# Patient Record
Sex: Male | Born: 1970 | Race: White | Hispanic: Yes | Smoking: Never smoker
Health system: Southern US, Community
[De-identification: ages and names within clinical notes are randomized; demographics above are authoritative.]

## PROBLEM LIST (undated history)

## (undated) DIAGNOSIS — Z87442 Personal history of urinary calculi: Secondary | ICD-10-CM

## (undated) DIAGNOSIS — Z0000 Encounter for general adult medical examination without abnormal findings: Principal | ICD-10-CM

## (undated) DIAGNOSIS — H9319 Tinnitus, unspecified ear: Secondary | ICD-10-CM

## (undated) DIAGNOSIS — M109 Gout, unspecified: Secondary | ICD-10-CM

## (undated) DIAGNOSIS — M549 Dorsalgia, unspecified: Secondary | ICD-10-CM

## (undated) DIAGNOSIS — D7389 Other diseases of spleen: Secondary | ICD-10-CM

## (undated) DIAGNOSIS — I10 Essential (primary) hypertension: Secondary | ICD-10-CM

## (undated) DIAGNOSIS — H409 Unspecified glaucoma: Secondary | ICD-10-CM

## (undated) DIAGNOSIS — D179 Benign lipomatous neoplasm, unspecified: Secondary | ICD-10-CM

## (undated) DIAGNOSIS — Z8739 Personal history of other diseases of the musculoskeletal system and connective tissue: Secondary | ICD-10-CM

## (undated) HISTORY — DX: Dorsalgia, unspecified: M54.9

## (undated) HISTORY — DX: Personal history of urinary calculi: Z87.442

## (undated) HISTORY — DX: Tinnitus, unspecified ear: H93.19

## (undated) HISTORY — DX: Unspecified glaucoma: H40.9

## (undated) HISTORY — DX: Gout, unspecified: M10.9

## (undated) HISTORY — DX: Benign lipomatous neoplasm, unspecified: D17.9

## (undated) HISTORY — DX: Other diseases of spleen: D73.89

## (undated) HISTORY — DX: Essential (primary) hypertension: I10

## (undated) HISTORY — DX: Personal history of other diseases of the musculoskeletal system and connective tissue: Z87.39

## (undated) HISTORY — DX: Encounter for general adult medical examination without abnormal findings: Z00.00

## (undated) HISTORY — PX: APPENDECTOMY: SHX54

---

## 2008-01-03 ENCOUNTER — Encounter: Payer: Self-pay | Admitting: Family Medicine

## 2009-03-11 ENCOUNTER — Emergency Department (HOSPITAL_COMMUNITY): Admission: EM | Admit: 2009-03-11 | Discharge: 2009-03-11 | Payer: Self-pay | Admitting: Emergency Medicine

## 2009-03-28 ENCOUNTER — Ambulatory Visit: Payer: Self-pay | Admitting: Family Medicine

## 2009-03-28 DIAGNOSIS — M109 Gout, unspecified: Secondary | ICD-10-CM | POA: Insufficient documentation

## 2009-04-15 ENCOUNTER — Encounter (INDEPENDENT_AMBULATORY_CARE_PROVIDER_SITE_OTHER): Payer: Self-pay | Admitting: *Deleted

## 2009-04-15 ENCOUNTER — Ambulatory Visit: Payer: Self-pay | Admitting: Family Medicine

## 2009-04-15 DIAGNOSIS — H409 Unspecified glaucoma: Secondary | ICD-10-CM | POA: Insufficient documentation

## 2009-04-15 DIAGNOSIS — M7710 Lateral epicondylitis, unspecified elbow: Secondary | ICD-10-CM

## 2009-04-15 DIAGNOSIS — H4089 Other specified glaucoma: Secondary | ICD-10-CM | POA: Insufficient documentation

## 2009-04-15 DIAGNOSIS — E669 Obesity, unspecified: Secondary | ICD-10-CM

## 2009-04-15 HISTORY — DX: Obesity, unspecified: E66.9

## 2009-04-15 HISTORY — DX: Lateral epicondylitis, unspecified elbow: M77.10

## 2009-04-15 LAB — CONVERTED CEMR LAB
BUN: 19 mg/dL (ref 6–23)
CO2: 25 meq/L (ref 19–32)
Calcium: 9.7 mg/dL (ref 8.4–10.5)
Chloride: 104 meq/L (ref 96–112)
Cholesterol: 179 mg/dL (ref 0–200)
Creatinine, Ser: 1.02 mg/dL (ref 0.40–1.50)
Glucose, Bld: 103 mg/dL — ABNORMAL HIGH (ref 70–99)
HDL: 46 mg/dL (ref >39)
Total Bilirubin: 0.3 mg/dL (ref 0.3–1.2)
Total CHOL/HDL Ratio: 3.9
Triglycerides: 97 mg/dL (ref <150)
VLDL: 19 mg/dL (ref 0–40)

## 2009-04-17 ENCOUNTER — Encounter: Payer: Self-pay | Admitting: Family Medicine

## 2011-02-17 ENCOUNTER — Encounter: Payer: Self-pay | Admitting: *Deleted

## 2011-04-26 ENCOUNTER — Ambulatory Visit
Admission: RE | Admit: 2011-04-26 | Discharge: 2011-04-26 | Disposition: A | Discharge status: Home / Self Care | Payer: BC Managed Care – PPO | Source: Ambulatory Visit | Attending: Gastroenterology | Admitting: Gastroenterology

## 2011-04-26 ENCOUNTER — Other Ambulatory Visit: Payer: Self-pay | Admitting: Gastroenterology

## 2011-04-26 MED ORDER — IOHEXOL 300 MG/ML  SOLN
100.0000 mL | Freq: Once | INTRAMUSCULAR | Status: AC | PRN
  Administered 2011-04-26: 100 mL via INTRAVENOUS

## 2013-03-29 IMAGING — CT CT ABD-PEL WO/W CM
2 of 5 series · 16 of 46 positions shown, 18 images · IV contrast (30CC OMNI 300 & [ID] OMNI 300)
Comparison: None.
COMPARISON: None.

***ADDENDUM*** CREATED: 04/26/2011 [DATE]

At the request of the referring physician, IV contrast was
administered.  Unfortunately, there was an infiltration of IV
contrast into the right antecubital fossa of nearly 100 ml.  The
patient was examined by Dr. Yin Kwan Kwan Ho.  Per report, the patient
had a strong radial pulse and good capillary refill.
Postprocedural care and discharge instructions were given to the
patient.
CLINICAL DATA: Left flank pain.  Obstructing left ureteral vesicle
junction stone on noncontrast CT abdomen pelvis performed earlier
the same day.  Evaluate for additional causes of left flank pain.
CT ABDOMEN AND PELVIS WITH CONTRAST
TECHNIQUE: Multidetector CT imaging of the abdomen and pelvis was
performed using the standard protocol following bolus
administration of intravenous contrast.
Contrast: 100 ml [id number].
CLINICAL DATA: Left flank pain.
CT ABDOMEN AND PELVIS WITHOUT CONTRAST
performed following the standard protocol without intravenous
contrast.

[Series 5: abdomen w/ · axial · 0.78mm/px · z∈[-407,-12]mm · 13 of 88 slices shown, 15 images]
[im 5/88  soft-tissue]
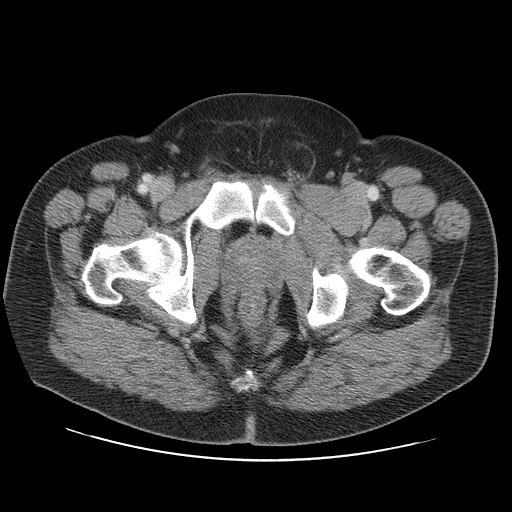
[im 5/88  bone]
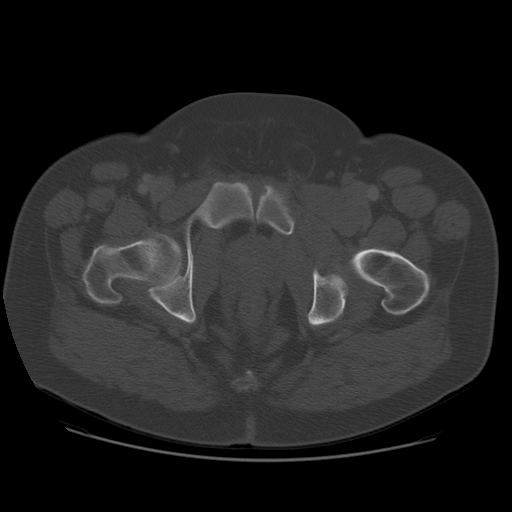
[im 14/88  soft-tissue]
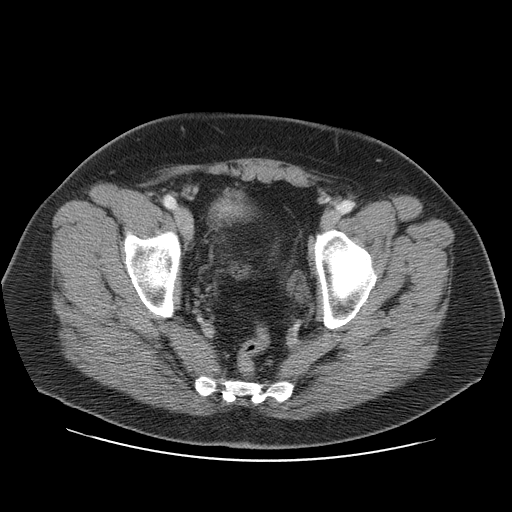
[im 19/88  soft-tissue]
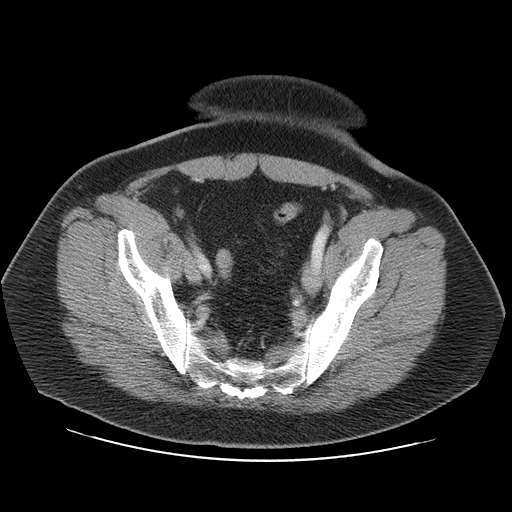
[im 23/88  soft-tissue]
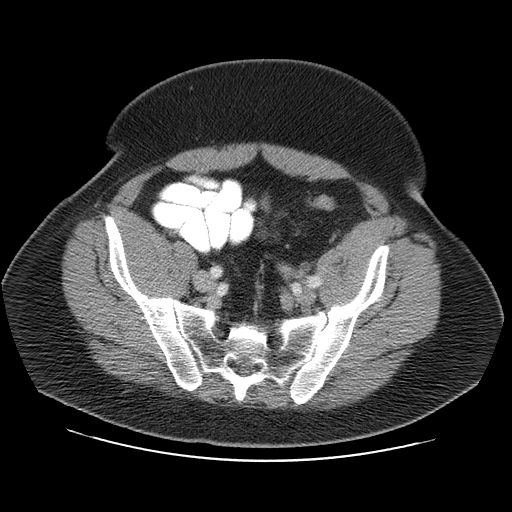
[im 33/88  soft-tissue]
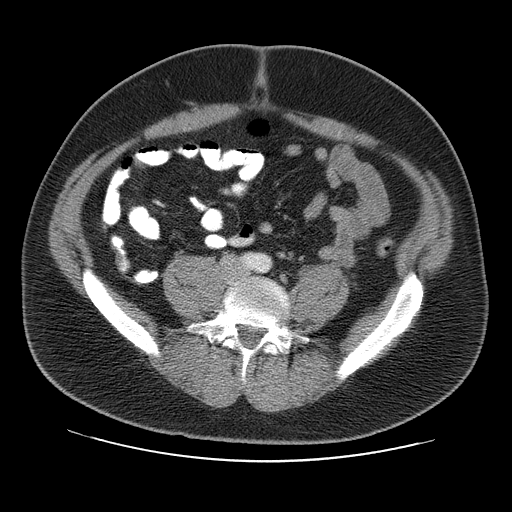
[im 37/88  soft-tissue]
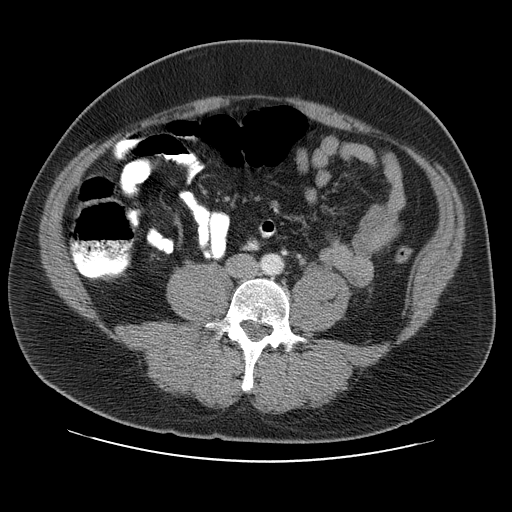
[im 46/88  soft-tissue]
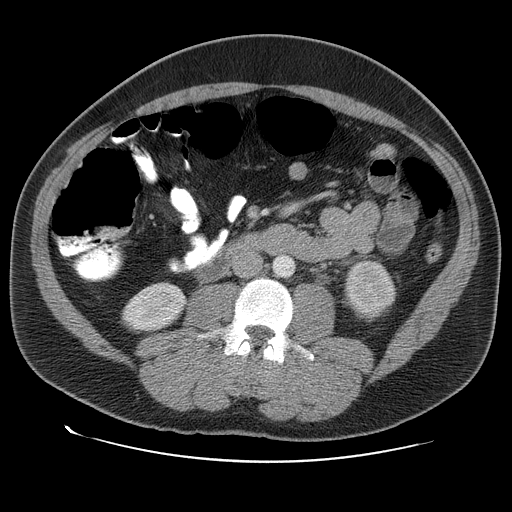
[im 51/88  soft-tissue]
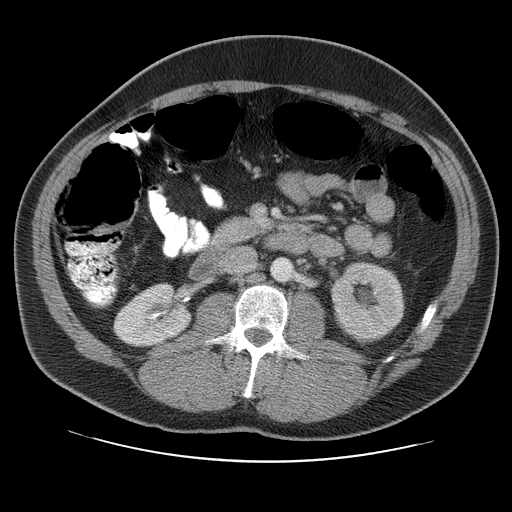
[im 55/88  soft-tissue]
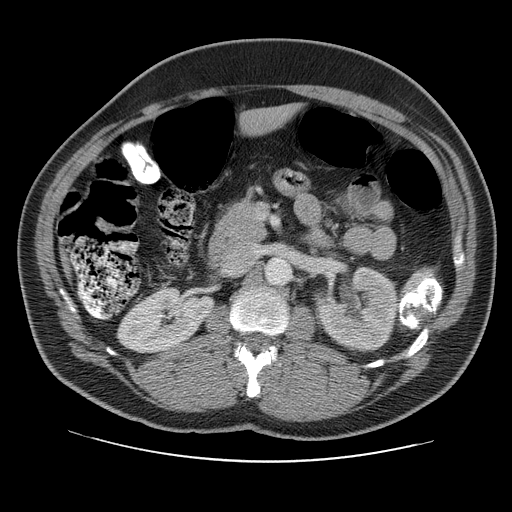
[im 55/88  bone]
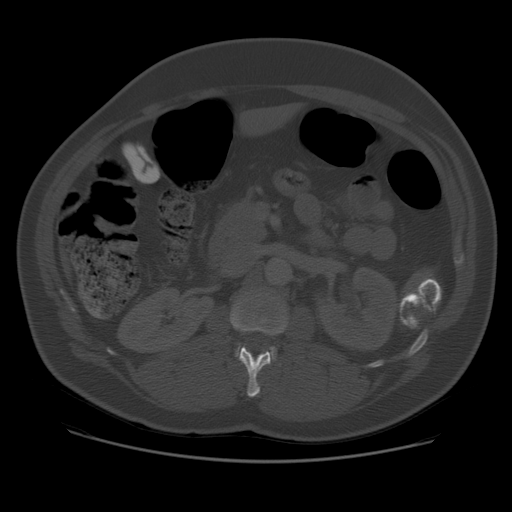
[im 65/88  soft-tissue]
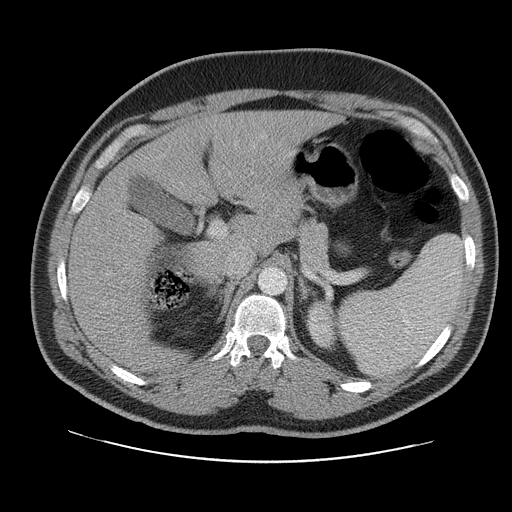
[im 69/88  soft-tissue]
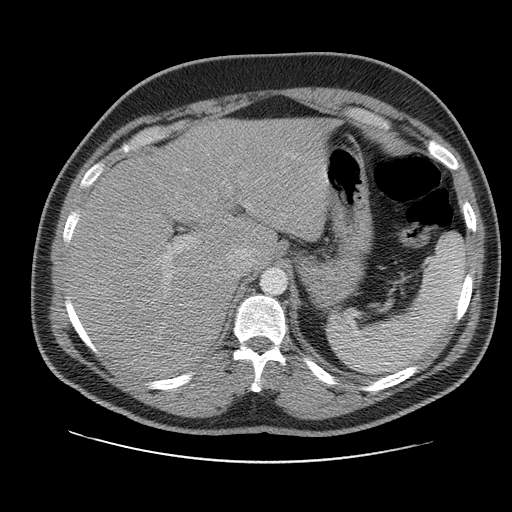
[im 74/88  soft-tissue]
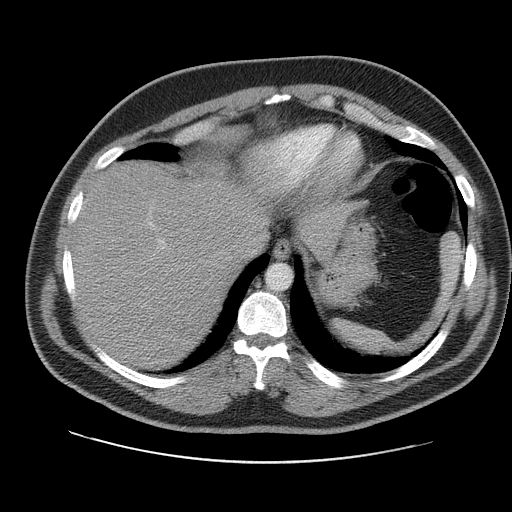
[im 83/88  soft-tissue]
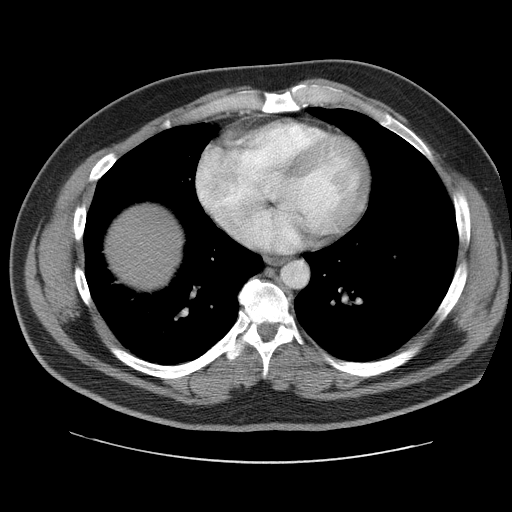

[Series 700: cor · coronal · 0.98mm/px · 3 of 133 slices shown]
[im 45/133  soft-tissue]
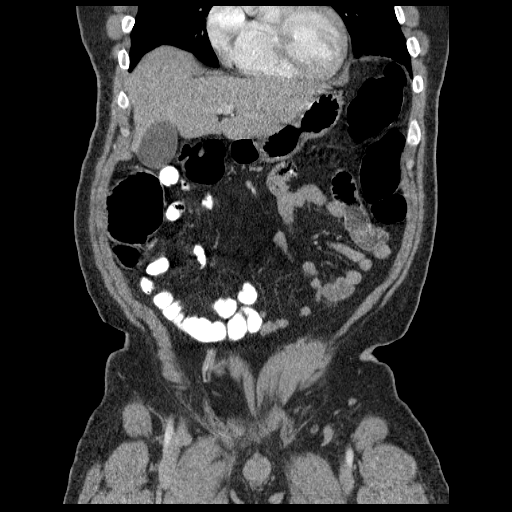
[im 59/133  soft-tissue]
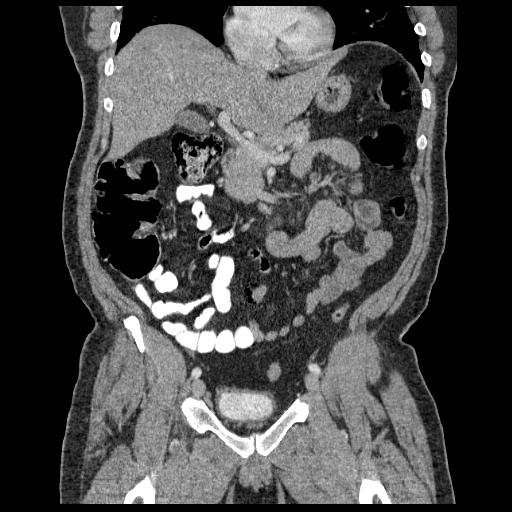
[im 74/133  soft-tissue]
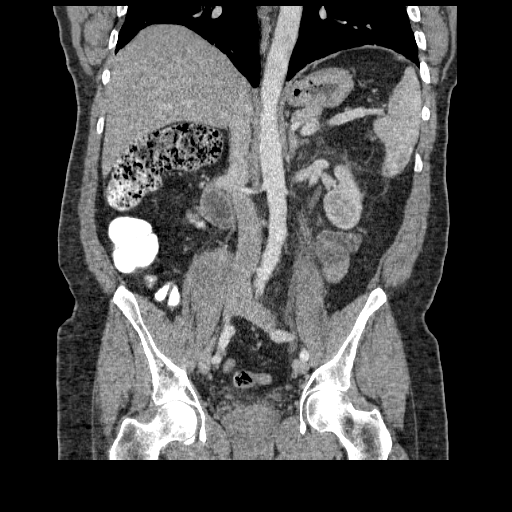

[16 of 46 positions shown; findings below may reference images not displayed]

FINDINGS: There are no new findings after administration of IV
contrast.  Left renal edema and mild left hydronephrosis secondary
to a 5 mm left ureteral vesicle junction stone are again noted.
IMPRESSION: 1.  No new findings after administration of IV contrast. IV
contrast infiltration of nearly 100 ml of contrast into the right
antecubital fossa.  The patient was examined and discharge
instructions given, as discussed above.
2.  Obstructing left ureteral vesicle junction stone.

***END ADDENDUM*** SIGNED BY: Lakeshia Del Cid, M.D.
FINDINGS: Lung bases are clear.  Heart size normal.  No pericardial
or pleural effusion.

Liver, gallbladder, adrenal glands and right kidney are
unremarkable.  There is left renal edema with a punctate stone in
the lower pole.  Mild left hydronephrosis and left periureteric
stranding are seen secondary to a 5 mm stone at the left ureteral
vesicle junction.

A peripherally calcified lesion in the spleen measures 4.1 x 3.9 cm
and may be post-traumatic or post infectious in etiology.  A
hemangioma is also considered.  Pancreas, stomach and bowel are
unremarkable.  No pathologically enlarged lymph nodes.  No free
fluid.  Small bilateral inguinal hernias contain fat.  No worrisome
lytic or sclerotic lesions.
IMPRESSION: 1.  Mildly obstructing 5 mm stone in the left ureteral vesicle
junction.
2.  Punctate left nephrolithiasis.

## 2015-04-14 DIAGNOSIS — N200 Calculus of kidney: Secondary | ICD-10-CM | POA: Insufficient documentation

## 2017-01-08 DIAGNOSIS — J069 Acute upper respiratory infection, unspecified: Secondary | ICD-10-CM | POA: Diagnosis not present

## 2017-01-10 DIAGNOSIS — J0100 Acute maxillary sinusitis, unspecified: Secondary | ICD-10-CM | POA: Diagnosis not present

## 2017-01-10 DIAGNOSIS — J40 Bronchitis, not specified as acute or chronic: Secondary | ICD-10-CM | POA: Diagnosis not present

## 2017-01-10 DIAGNOSIS — Z6834 Body mass index (BMI) 34.0-34.9, adult: Secondary | ICD-10-CM | POA: Diagnosis not present

## 2017-01-11 ENCOUNTER — Ambulatory Visit: Payer: Self-pay | Admitting: Family Medicine

## 2017-01-31 DIAGNOSIS — H5213 Myopia, bilateral: Secondary | ICD-10-CM | POA: Diagnosis not present

## 2017-01-31 DIAGNOSIS — H401111 Primary open-angle glaucoma, right eye, mild stage: Secondary | ICD-10-CM | POA: Diagnosis not present

## 2017-01-31 DIAGNOSIS — H40053 Ocular hypertension, bilateral: Secondary | ICD-10-CM | POA: Diagnosis not present

## 2017-01-31 DIAGNOSIS — H401131 Primary open-angle glaucoma, bilateral, mild stage: Secondary | ICD-10-CM | POA: Diagnosis not present

## 2017-01-31 DIAGNOSIS — H401121 Primary open-angle glaucoma, left eye, mild stage: Secondary | ICD-10-CM | POA: Diagnosis not present

## 2017-03-24 ENCOUNTER — Ambulatory Visit (INDEPENDENT_AMBULATORY_CARE_PROVIDER_SITE_OTHER): Payer: BLUE CROSS/BLUE SHIELD | Admitting: Family Medicine

## 2017-03-24 ENCOUNTER — Encounter: Payer: Self-pay | Admitting: Family Medicine

## 2017-03-24 ENCOUNTER — Ambulatory Visit (HOSPITAL_BASED_OUTPATIENT_CLINIC_OR_DEPARTMENT_OTHER)
Admission: RE | Admit: 2017-03-24 | Discharge: 2017-03-24 | Disposition: A | Discharge status: Home / Self Care | Payer: BLUE CROSS/BLUE SHIELD | Source: Ambulatory Visit | Attending: Family Medicine | Admitting: Family Medicine

## 2017-03-24 VITALS — BP 135/86 | HR 68 | Temp 97.9°F | Ht 68.0 in | Wt 220.4 lb

## 2017-03-24 DIAGNOSIS — M549 Dorsalgia, unspecified: Secondary | ICD-10-CM

## 2017-03-24 DIAGNOSIS — R222 Localized swelling, mass and lump, trunk: Secondary | ICD-10-CM

## 2017-03-24 DIAGNOSIS — M1A0790 Idiopathic chronic gout, unspecified ankle and foot, without tophus (tophi): Secondary | ICD-10-CM | POA: Diagnosis not present

## 2017-03-24 DIAGNOSIS — D179 Benign lipomatous neoplasm, unspecified: Secondary | ICD-10-CM

## 2017-03-24 DIAGNOSIS — I10 Essential (primary) hypertension: Secondary | ICD-10-CM

## 2017-03-24 DIAGNOSIS — Z87442 Personal history of urinary calculi: Secondary | ICD-10-CM

## 2017-03-24 DIAGNOSIS — M109 Gout, unspecified: Secondary | ICD-10-CM

## 2017-03-24 DIAGNOSIS — Z8739 Personal history of other diseases of the musculoskeletal system and connective tissue: Secondary | ICD-10-CM | POA: Diagnosis not present

## 2017-03-24 DIAGNOSIS — D171 Benign lipomatous neoplasm of skin and subcutaneous tissue of trunk: Secondary | ICD-10-CM

## 2017-03-24 DIAGNOSIS — E782 Mixed hyperlipidemia: Secondary | ICD-10-CM | POA: Diagnosis not present

## 2017-03-24 DIAGNOSIS — H9313 Tinnitus, bilateral: Secondary | ICD-10-CM

## 2017-03-24 DIAGNOSIS — M7711 Lateral epicondylitis, right elbow: Secondary | ICD-10-CM

## 2017-03-24 DIAGNOSIS — Z0000 Encounter for general adult medical examination without abnormal findings: Secondary | ICD-10-CM

## 2017-03-24 DIAGNOSIS — D7389 Other diseases of spleen: Secondary | ICD-10-CM

## 2017-03-24 DIAGNOSIS — H409 Unspecified glaucoma: Secondary | ICD-10-CM

## 2017-03-24 DIAGNOSIS — E6609 Other obesity due to excess calories: Secondary | ICD-10-CM

## 2017-03-24 DIAGNOSIS — H9319 Tinnitus, unspecified ear: Secondary | ICD-10-CM

## 2017-03-24 HISTORY — DX: Tinnitus, unspecified ear: H93.19

## 2017-03-24 HISTORY — DX: Benign lipomatous neoplasm, unspecified: D17.9

## 2017-03-24 HISTORY — DX: Personal history of other diseases of the musculoskeletal system and connective tissue: Z87.39

## 2017-03-24 HISTORY — DX: Dorsalgia, unspecified: M54.9

## 2017-03-24 HISTORY — DX: Personal history of urinary calculi: Z87.442

## 2017-03-24 HISTORY — DX: Other diseases of spleen: D73.89

## 2017-03-24 HISTORY — DX: Mixed hyperlipidemia: E78.2

## 2017-03-24 LAB — COMPREHENSIVE METABOLIC PANEL
ALBUMIN: 4.2 g/dL (ref 3.6–5.1)
ALT: 26 U/L (ref 9–46)
AST: 20 U/L (ref 10–40)
Alkaline Phosphatase: 67 U/L (ref 40–115)
BILIRUBIN TOTAL: 0.6 mg/dL (ref 0.2–1.2)
BUN: 14 mg/dL (ref 7–25)
CO2: 23 mmol/L (ref 20–31)
CREATININE: 1.04 mg/dL (ref 0.60–1.35)
Calcium: 9.5 mg/dL (ref 8.6–10.3)
Chloride: 104 mmol/L (ref 98–110)
GLUCOSE: 88 mg/dL (ref 65–99)
Potassium: 4.5 mmol/L (ref 3.5–5.3)
SODIUM: 138 mmol/L (ref 135–146)
Total Protein: 6.8 g/dL (ref 6.1–8.1)

## 2017-03-24 LAB — LIPID PANEL
Cholesterol: 167 mg/dL (ref <200)
HDL: 44 mg/dL (ref >40)
LDL CALC: 103 mg/dL — AB (ref <100)
Total CHOL/HDL Ratio: 3.8 Ratio (ref <5.0)
Triglycerides: 100 mg/dL (ref <150)
VLDL: 20 mg/dL (ref <30)

## 2017-03-24 LAB — CBC
HCT: 45.2 % (ref 38.5–50.0)
Hemoglobin: 15.4 g/dL (ref 13.2–17.1)
MCH: 29.8 pg (ref 27.0–33.0)
MCHC: 34.1 g/dL (ref 32.0–36.0)
MCV: 87.4 fL (ref 80.0–100.0)
MPV: 9.8 fL (ref 7.5–12.5)
PLATELETS: 266 10*3/uL (ref 140–400)
RBC: 5.17 MIL/uL (ref 4.20–5.80)
RDW: 13.5 % (ref 11.0–15.0)
WBC: 9 10*3/uL (ref 3.8–10.8)

## 2017-03-24 LAB — TSH: TSH: 1.09 m[IU]/L (ref 0.40–4.50)

## 2017-03-24 NOTE — Progress Notes (Signed)
Pre visit review using our clinic review tool, if applicable. No additional management support is needed unless otherwise documented below in the visit note. 

## 2017-03-24 NOTE — Progress Notes (Signed)
Patient ID: Joseph [name], male   DOB: 21-Nov-1971, 46 y.o.   MRN: [id number]   Subjective:  I acted as a Education administrator for Penni Homans, [place], [place]   Patient ID: Joseph [name], male    DOB: [birth year], 46 y.o.   MRN: [id number]  Chief Complaint  Patient presents with  . Establish Care    Hypertension  This is a chronic problem. The problem is uncontrolled. Pertinent negatives include no blurred vision, chest pain, headaches, malaise/fatigue, palpitations or shortness of breath.    Patient is in today for to establish care with Provider. Patient has a Hx of HTN, glaucoma and gout. Patient has a complaint of: right arm pain, with some swelling and pain for the past two months. Can't recall any injury to the arm. Does do a great deal of repetitive work with his arm.Also a complaint of back pain on the left side for quite some time. And a complaint of pain at the back of the left knee. Patient has no additional acute concerns noted at this time. Has a history of low back pain but not symptomatic at this time. Notes a growing soft lesion on his back, present for several years. Denies CP/palp/SOB/HA/congestion/fevers/GI or GU c/o. Taking meds as prescribed  Patient Care Team: Mosie Lukes, MD as PCP - General (Family Medicine) Hortencia Pilar, MD as Consulting Physician (Surgery)   Past Medical History:  Diagnosis Date  . Back pain 03/24/2017  . 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 Glaucoma   . Gout   . History of degenerative disc disease 03/24/2017  . History of kidney stones 03/24/2017  . Hypertension   . Lipoma 03/24/2017  . Tinnitus 03/24/2017    Past Surgical History:  Procedure Laterality Date  . APPENDECTOMY      Family History  Problem Relation Age of Onset  . Diabetes Mother   . Glaucoma Father   . Hypertension Father     Social History   Social History  . Marital status: Single    Spouse name: N/A  . Number of children: N/A  . Years of education: N/A    Occupational History  . Not on file.   Social History Main Topics  . Smoking status: Never Smoker  . Smokeless tobacco: Never Used  . Alcohol use 1.2 oz/week    2 Cans of beer per week  . Drug use: No  . Sexual activity: Not on file   Other Topics Concern  . Not on file   Social History Narrative   Works as Marine scientist, no dietary restrictions.    Outpatient Medications Prior to Visit  Medication Sig Dispense Refill  . dorzolamide-timolol (COSOPT) 22.3-6.8 MG/ML ophthalmic solution Place 1 drop into both eyes 2 (two) times daily.      . indomethacin (INDOCIN) 25 MG capsule SIG: Take 3 capsules (75 mg) by mouth twice daily as needed for gout      No facility-administered medications prior to visit.     No Known Allergies  Review of Systems  Constitutional: Negative for fever and malaise/fatigue.  HENT: Negative for congestion.   Eyes: Negative for blurred vision.  Respiratory: Negative for cough and shortness of breath.   Cardiovascular: Negative for chest pain, palpitations and leg swelling.  Gastrointestinal: Negative for vomiting.  Musculoskeletal: Positive for back pain, joint pain and myalgias.       R arm pian and swelling. Pain in back on L side. Pain in back of L  knee.  Skin: Negative for rash.       nontender but growing lesion on back  Neurological: Negative for loss of consciousness and headaches.       Objective:    Physical Exam  Constitutional: He is oriented to person, place, and time. He appears well-developed and well-nourished. No distress.  HENT:  Head: Normocephalic and atraumatic.  Eyes: Conjunctivae are normal.  Neck: Normal range of motion. No thyromegaly present.  Cardiovascular: Normal rate and regular rhythm.   Pulmonary/Chest: Effort normal and breath sounds normal. He has no wheezes.  Abdominal: Soft. Bowel sounds are normal. There is no tenderness.  Musculoskeletal: He exhibits no edema or deformity.  2 inch,  raised soft, circumscribed lesion on left side of back. nontender  Neurological: He is alert and oriented to person, place, and time.  Skin: Skin is warm and dry. He is not diaphoretic.  Psychiatric: He has a normal mood and affect.    BP 135/86 (BP Location: Left Arm, Patient Position: Sitting, Cuff Size: Normal)   Pulse 68   Temp 97.9 F (36.6 C) (Oral)   Ht 5\' 8"  (1.727 m)   Wt 220 lb 6.4 oz (100 kg)   SpO2 97% Comment: RA  BMI 33.51 kg/m  Wt Readings from Last 3 Encounters:  03/24/17 220 lb 6.4 oz (100 kg)  04/15/09 215 lb 4.8 oz (97.7 kg)   BP Readings from Last 3 Encounters:  03/24/17 135/86  04/15/09 125/86      There is no immunization history on file for this patient.  Health Maintenance  Topic Date Due  . HIV Screening  12/04/1984  . TETANUS/TDAP  12/04/1988  . INFLUENZA VACCINE  07/27/2017    Lab Results  Component Value Date   WBC 9.0 03/24/2017   HGB 15.4 03/24/2017   HCT 45.2 03/24/2017   PLT 266 03/24/2017   GLUCOSE 88 03/24/2017   CHOL 167 03/24/2017   TRIG 100 03/24/2017   HDL 44 03/24/2017   LDLCALC 103 (H) 03/24/2017   ALT 26 03/24/2017   AST 20 03/24/2017   NA 138 03/24/2017   K 4.5 03/24/2017   CL 104 03/24/2017   CREATININE 1.04 03/24/2017   BUN 14 03/24/2017   CO2 23 03/24/2017   TSH 1.09 03/24/2017   HGBA1C 5.2 03/24/2017    Lab Results  Component Value Date   TSH 1.09 03/24/2017   Lab Results  Component Value Date   WBC 9.0 03/24/2017   HGB 15.4 03/24/2017   HCT 45.2 03/24/2017   MCV 87.4 03/24/2017   PLT 266 03/24/2017   Lab Results  Component Value Date   NA 138 03/24/2017   K 4.5 03/24/2017   CO2 23 03/24/2017   GLUCOSE 88 03/24/2017   BUN 14 03/24/2017   CREATININE 1.04 03/24/2017   BILITOT 0.6 03/24/2017   ALKPHOS 67 03/24/2017   AST 20 03/24/2017   ALT 26 03/24/2017   PROT 6.8 03/24/2017   ALBUMIN 4.2 03/24/2017   CALCIUM 9.5 03/24/2017   Lab Results  Component Value Date   CHOL 167 03/24/2017   Lab  Results  Component Value Date   HDL 44 03/24/2017   Lab Results  Component Value Date   LDLCALC 103 (H) 03/24/2017   Lab Results  Component Value Date   TRIG 100 03/24/2017   Lab Results  Component Value Date   CHOLHDL 3.8 03/24/2017   Lab Results  Component Value Date   HGBA1C 5.2 03/24/2017  Assessment & Plan:   Problem List Items Addressed This Visit    Gout - Primary    Uric acid level within normal limits but high end. May use Diclofenac sparingly in future if he has a flare. Avoid offending foods.      Relevant Orders   Uric acid (Completed)   Uric acid (Completed)   Obesity    Encouraged DASH diet, decrease po intake and increase exercise as tolerated. Needs 7-8 hours of sleep nightly. Avoid trans fats, eat small, frequent meals every 4-5 hours with lean proteins, complex carbs and healthy fats. Minimize simple carbs      Lateral epicondylitis    Due to repetitive work. Encouraged ice, topical treatments, bracing and will refer to sports med if does not resolve      Relevant Medications   indomethacin (INDOCIN SR) 75 MG CR capsule   RESOLVED: Back pain   Relevant Medications   indomethacin (INDOCIN SR) 75 MG CR capsule   Lipoma    On back, has been present for quite some time but is growing, ultrasound confirms lipoma.       Tinnitus   History of degenerative disc disease    Asymptomatic at this time. Encouraged moist heat and gentle stretching as tolerated. May try NSAIDs and prescription meds as directed and report if symptoms worsen or seek immediate care      Calcification of spleen   History of kidney stones   Hyperlipidemia, mixed    Encouraged heart healthy diet, increase exercise, avoid trans fats, consider a krill oil cap daily      Relevant Orders   Lipid panel (Completed)    Other Visit Diagnoses    Preventative health care       Relevant Orders   Comprehensive metabolic panel (Completed)   Lipid panel (Completed)   Uric  acid (Completed)   CBC (Completed)   TSH (Completed)   Glaucoma, unspecified glaucoma type, unspecified laterality       Relevant Orders   Hemoglobin A1c (Completed)   Hypertension, unspecified type       Relevant Orders   Comprehensive metabolic panel (Completed)   CBC (Completed)   TSH (Completed)   Mass on back       Relevant Orders   [place] Chest (Completed)      I am having Mr. Kanady maintain his dorzolamide-timolol and indomethacin.  Meds ordered this encounter  Medications  . indomethacin (INDOCIN SR) 75 MG CR capsule    Sig: Take 75 mg by mouth 2 (two) times daily with a meal.    CMA served as scribe during this visit. History, Physical and Plan performed by medical provider. Documentation and orders reviewed and attested to.  Penni Homans, MD

## 2017-03-24 NOTE — Patient Instructions (Addendum)
Apply Salonpas, Apspercreme, Icy Hot (with Lidocaine) to the affected area as needed. Strongly encouraged to drink 64 ounces of water per day. Preventive Care 40-64 Years, Male Preventive care refers to lifestyle choices and visits with your health care provider that can promote health and wellness. What does preventive care include?  A yearly physical exam. This is also called an annual well check.  Dental exams once or twice a year.  Routine eye exams. Ask your health care provider how often you should have your eyes checked.  Personal lifestyle choices, including:  Daily care of your teeth and gums.  Regular physical activity.  Eating a healthy diet.  Avoiding tobacco and drug use.  Limiting alcohol use.  Practicing safe sex.  Taking low-dose aspirin every day starting at age 40. What happens during an annual well check? The services and screenings done by your health care provider during your annual well check will depend on your age, overall health, lifestyle risk factors, and family history of disease. Counseling  Your health care provider may ask you questions about your:  Alcohol use.  Tobacco use.  Drug use.  Emotional well-being.  Home and relationship well-being.  Sexual activity.  Eating habits.  Work and work Statistician. Screening  You may have the following tests or measurements:  Height, weight, and BMI.  Blood pressure.  Lipid and cholesterol levels. These may be checked every 5 years, or more frequently if you are over 21 years old.  Skin check.  Lung cancer screening. You may have this screening every year starting at age 94 if you have a 30-pack-year history of smoking and currently smoke or have quit within the past 15 years.  Fecal occult blood test (FOBT) of the stool. You may have this test every year starting at age 29.  Flexible sigmoidoscopy or colonoscopy. You may have a sigmoidoscopy every 5 years or a colonoscopy every 10  years starting at age 67.  Prostate cancer screening. Recommendations will vary depending on your family history and other risks.  Hepatitis C blood test.  Hepatitis B blood test.  Sexually transmitted disease (STD) testing.  Diabetes screening. This is done by checking your blood sugar (glucose) after you have not eaten for a while (fasting). You may have this done every 1-3 years. Discuss your test results, treatment options, and if necessary, the need for more tests with your health care provider. Vaccines  Your health care provider may recommend certain vaccines, such as:  Influenza vaccine. This is recommended every year.  Tetanus, diphtheria, and acellular pertussis (Tdap, Td) vaccine. You may need a Td booster every 10 years.  Varicella vaccine. You may need this if you have not been vaccinated.  Zoster vaccine. You may need this after age 37.  Measles, mumps, and rubella (MMR) vaccine. You may need at least one dose of MMR if you were born in 1957 or later. You may also need a second dose.  Pneumococcal 13-valent conjugate (PCV13) vaccine. You may need this if you have certain conditions and have not been vaccinated.  Pneumococcal polysaccharide (PPSV23) vaccine. You may need one or two doses if you smoke cigarettes or if you have certain conditions.  Meningococcal vaccine. You may need this if you have certain conditions.  Hepatitis A vaccine. You may need this if you have certain conditions or if you travel or work in places where you may be exposed to hepatitis A.  Hepatitis B vaccine. You may need this if you have certain  conditions or if you travel or work in places where you may be exposed to hepatitis B.  Haemophilus influenzae type b (Hib) vaccine. You may need this if you have certain risk factors. Talk to your health care provider about which screenings and vaccines you need and how often you need them. This information is not intended to replace advice given to  you by your health care provider. Make sure you discuss any questions you have with your health care provider. Document Released: 01/09/2016 Document Revised: 09/01/2016 Document Reviewed: 10/14/2015 Elsevier Interactive Patient Education  2017 [employer].

## 2017-03-25 LAB — URIC ACID: URIC ACID, SERUM: 7.6 mg/dL (ref 4.0–8.0)

## 2017-03-25 LAB — HEMOGLOBIN A1C
Hgb A1c MFr Bld: 5.2 % (ref <5.7)
Mean Plasma Glucose: 103 mg/dL

## 2017-03-27 NOTE — Assessment & Plan Note (Signed)
Due to repetitive work. Encouraged ice, topical treatments, bracing and will refer to sports med if does not resolve

## 2017-03-27 NOTE — Assessment & Plan Note (Signed)
Uric acid level within normal limits but high end. May use Diclofenac sparingly in future if he has a flare. Avoid offending foods.

## 2017-03-27 NOTE — Assessment & Plan Note (Signed)
On back, has been present for quite some time but is growing, ultrasound confirms lipoma.

## 2017-03-27 NOTE — Assessment & Plan Note (Signed)
Encouraged DASH diet, decrease po intake and increase exercise as tolerated. Needs 7-8 hours of sleep nightly. Avoid trans fats, eat small, frequent meals every 4-5 hours with lean proteins, complex carbs and healthy fats. Minimize simple carbs 

## 2017-03-27 NOTE — Assessment & Plan Note (Signed)
Asymptomatic at this time. Encouraged moist heat and gentle stretching as tolerated. May try NSAIDs and prescription meds as directed and report if symptoms worsen or seek immediate care

## 2017-03-27 NOTE — Assessment & Plan Note (Signed)
Encouraged heart healthy diet, increase exercise, avoid trans fats, consider a krill oil cap daily 

## 2017-03-28 ENCOUNTER — Other Ambulatory Visit: Payer: Self-pay | Admitting: Family Medicine

## 2017-03-28 MED ORDER — DICLOFENAC SODIUM 75 MG PO TBEC
75.0000 mg | DELAYED_RELEASE_TABLET | Freq: Two times a day (BID) | ORAL | 1 refills | Status: DC
Start: 2017-03-28 — End: 2017-10-10

## 2017-09-16 DIAGNOSIS — H40053 Ocular hypertension, bilateral: Secondary | ICD-10-CM | POA: Diagnosis not present

## 2017-09-16 DIAGNOSIS — H40051 Ocular hypertension, right eye: Secondary | ICD-10-CM | POA: Diagnosis not present

## 2017-09-16 DIAGNOSIS — H25013 Cortical age-related cataract, bilateral: Secondary | ICD-10-CM | POA: Diagnosis not present

## 2017-09-16 DIAGNOSIS — H401111 Primary open-angle glaucoma, right eye, mild stage: Secondary | ICD-10-CM | POA: Diagnosis not present

## 2017-09-16 DIAGNOSIS — H401131 Primary open-angle glaucoma, bilateral, mild stage: Secondary | ICD-10-CM | POA: Diagnosis not present

## 2017-09-16 DIAGNOSIS — H40052 Ocular hypertension, left eye: Secondary | ICD-10-CM | POA: Diagnosis not present

## 2017-09-16 DIAGNOSIS — H401121 Primary open-angle glaucoma, left eye, mild stage: Secondary | ICD-10-CM | POA: Diagnosis not present

## 2017-09-16 DIAGNOSIS — H2513 Age-related nuclear cataract, bilateral: Secondary | ICD-10-CM | POA: Diagnosis not present

## 2017-10-10 ENCOUNTER — Encounter: Payer: Self-pay | Admitting: Family Medicine

## 2017-10-10 ENCOUNTER — Ambulatory Visit (INDEPENDENT_AMBULATORY_CARE_PROVIDER_SITE_OTHER): Payer: BLUE CROSS/BLUE SHIELD | Admitting: Family Medicine

## 2017-10-10 VITALS — BP 138/96 | HR 77 | Temp 98.4°F | Resp 18 | Ht 68.0 in | Wt 228.4 lb

## 2017-10-10 DIAGNOSIS — Z7184 Encounter for health counseling related to travel: Secondary | ICD-10-CM

## 2017-10-10 DIAGNOSIS — Z7189 Other specified counseling: Secondary | ICD-10-CM | POA: Diagnosis not present

## 2017-10-10 DIAGNOSIS — M1A0790 Idiopathic chronic gout, unspecified ankle and foot, without tophus (tophi): Secondary | ICD-10-CM | POA: Diagnosis not present

## 2017-10-10 DIAGNOSIS — E782 Mixed hyperlipidemia: Secondary | ICD-10-CM

## 2017-10-10 DIAGNOSIS — Z79899 Other long term (current) drug therapy: Secondary | ICD-10-CM

## 2017-10-10 DIAGNOSIS — Z23 Encounter for immunization: Secondary | ICD-10-CM

## 2017-10-10 DIAGNOSIS — I10 Essential (primary) hypertension: Secondary | ICD-10-CM | POA: Diagnosis not present

## 2017-10-10 DIAGNOSIS — Z0000 Encounter for general adult medical examination without abnormal findings: Secondary | ICD-10-CM

## 2017-10-10 DIAGNOSIS — E6609 Other obesity due to excess calories: Secondary | ICD-10-CM

## 2017-10-10 DIAGNOSIS — B351 Tinea unguium: Secondary | ICD-10-CM

## 2017-10-10 HISTORY — DX: Tinea unguium: B35.1

## 2017-10-10 HISTORY — DX: Essential (primary) hypertension: I10

## 2017-10-10 HISTORY — DX: Encounter for general adult medical examination without abnormal findings: Z00.00

## 2017-10-10 HISTORY — DX: Encounter for health counseling related to travel: Z71.84

## 2017-10-10 LAB — URIC ACID: Uric Acid, Serum: 8.1 mg/dL — ABNORMAL HIGH (ref 4.0–7.8)

## 2017-10-10 LAB — COMPREHENSIVE METABOLIC PANEL
ALT: 27 U/L (ref 0–53)
AST: 19 U/L (ref 0–37)
Albumin: 4.4 g/dL (ref 3.5–5.2)
Alkaline Phosphatase: 62 U/L (ref 39–117)
BUN: 14 mg/dL (ref 6–23)
CHLORIDE: 101 meq/L (ref 96–112)
CO2: 29 mEq/L (ref 19–32)
Calcium: 9.6 mg/dL (ref 8.4–10.5)
Creatinine, Ser: 1 mg/dL (ref 0.40–1.50)
GFR: 84.82 mL/min (ref >60.00)
Glucose, Bld: 98 mg/dL (ref 70–99)
POTASSIUM: 4.5 meq/L (ref 3.5–5.1)
Sodium: 136 mEq/L (ref 135–145)
TOTAL PROTEIN: 7 g/dL (ref 6.0–8.3)
Total Bilirubin: 0.6 mg/dL (ref 0.2–1.2)

## 2017-10-10 LAB — LIPID PANEL
Cholesterol: 171 mg/dL (ref 0–200)
HDL: 43.1 mg/dL (ref >39.00)
LDL CALC: 105 mg/dL — AB (ref 0–99)
NONHDL: 128.09
Total CHOL/HDL Ratio: 4
Triglycerides: 114 mg/dL (ref 0.0–149.0)
VLDL: 22.8 mg/dL (ref 0.0–40.0)

## 2017-10-10 LAB — CBC
HEMATOCRIT: 47.9 % (ref 39.0–52.0)
HEMOGLOBIN: 16.1 g/dL (ref 13.0–17.0)
MCHC: 33.6 g/dL (ref 30.0–36.0)
MCV: 89.2 fl (ref 78.0–100.0)
Platelets: 255 10*3/uL (ref 150.0–400.0)
RBC: 5.37 Mil/uL (ref 4.22–5.81)
RDW: 13 % (ref 11.5–15.5)
WBC: 7.7 10*3/uL (ref 4.0–10.5)

## 2017-10-10 LAB — TSH: TSH: 1.62 u[IU]/mL (ref 0.35–4.50)

## 2017-10-10 MED ORDER — HYDROCODONE-ACETAMINOPHEN 5-325 MG PO TABS: 1.0000 | ORAL_TABLET | Freq: Four times a day (QID) | ORAL | 0 refills | Status: DC | PRN

## 2017-10-10 MED ORDER — DICLOFENAC SODIUM 75 MG PO TBEC: 75.0000 mg | DELAYED_RELEASE_TABLET | Freq: Two times a day (BID) | ORAL | 1 refills | Status: DC

## 2017-10-10 MED ORDER — COLCHICINE 0.6 MG PO CAPS: ORAL_CAPSULE | ORAL | 1 refills | Status: DC

## 2017-10-10 MED ORDER — METOPROLOL SUCCINATE ER 25 MG PO TB24: 25.0000 mg | ORAL_TABLET | Freq: Every day | ORAL | 3 refills | Status: DC

## 2017-10-10 MED ORDER — INDOMETHACIN 50 MG PO CAPS: 50.0000 mg | ORAL_CAPSULE | Freq: Three times a day (TID) | ORAL | 2 refills | Status: DC | PRN

## 2017-10-10 NOTE — Assessment & Plan Note (Signed)
Encouraged DASH diet, decrease po intake and increase exercise as tolerated. Needs 7-8 hours of sleep nightly. Avoid trans fats, eat small, frequent meals every 4-5 hours with lean proteins, complex carbs and healthy fats. Minimize simple carbs 

## 2017-10-10 NOTE — Progress Notes (Signed)
Subjective:  I acted as a Education administrator for Dr. Charlett Blake. [place], [place]  Patient ID: Joseph [name], male    DOB: 08-Jul-1971, 46 y.o.   MRN: [id number]  No chief complaint on file.   HPI  Patient is in today for an annual exam And follow-up on numerous medical concerns. He is accompanied by his wife. He notes his blood pressure was recently 160/120 at the [facility] and he is concerned about these numbers. He has not checked regularly. He is concerned that there may be a mild come infection in his right lower gumline but he denies any pain or bleeding. He also notes some pain in his right big toe as well as some changes in the nail in his right big toe. He denies trauma. They're traveling to [place] soon and he is in need of some immunizations and is unsure if he had his measles mumps and rubella in the past. No recent febrile illness or hospitalization. Is trying to maintain a heart healthy diet and stay active. Denies CP/palp/SOB/HA/congestion/fevers/GI or GU c/o. Taking meds as prescribed  Patient Care Team: Mosie Lukes, MD as PCP - General (Family Medicine) Hortencia Pilar, MD as Consulting Physician (Surgery)   Past Medical History:  Diagnosis Date  . Back pain 03/24/2017  . 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 Glaucoma   . Gout   . History of degenerative disc disease 03/24/2017  . History of kidney stones 03/24/2017  . HTN (hypertension) 10/10/2017  . Hypertension   . Lipoma 03/24/2017  . Preventative health care 10/10/2017  . Tinnitus 03/24/2017    Past Surgical History:  Procedure Laterality Date  . APPENDECTOMY      Family History  Problem Relation Age of Onset  . Diabetes Mother   . Glaucoma Father   . Hypertension Father     Social History   Social History  . Marital status: Single    Spouse name: N/A  . Number of children: N/A  . Years of education: N/A   Occupational History  . Not on file.   Social History Main Topics  . Smoking status: Never Smoker  .  Smokeless tobacco: Never Used  . Alcohol use 1.2 oz/week    2 Cans of beer per week  . Drug use: No  . Sexual activity: Not on file   Other Topics Concern  . Not on file   Social History Narrative   Works as Marine scientist, no dietary restrictions.    Outpatient Medications Prior to Visit  Medication Sig Dispense Refill  . dorzolamide-timolol (COSOPT) 22.3-6.8 MG/ML ophthalmic solution Place 1 drop into both eyes 2 (two) times daily.      . diclofenac (VOLTAREN) 75 MG EC tablet Take 1 tablet (75 mg total) by mouth 2 (two) times daily. Used for gout flare and take with food. 30 tablet 1  . indomethacin (INDOCIN SR) 75 MG CR capsule Take 75 mg by mouth 2 (two) times daily with a meal.     No facility-administered medications prior to visit.     No Known Allergies  ROS     Objective:    Physical Exam  BP (!) 138/96 (BP Location: Left Arm, Patient Position: Sitting, Cuff Size: Normal)   Pulse 77   Temp 98.4 F (36.9 C) (Oral)   Resp 18   Ht '5\' 8"'  (1.727 m)   Wt 228 lb 6.4 oz (103.6 kg)   SpO2 98%   BMI 34.73 kg/m  Wt Readings from Last 3 Encounters:  10/10/17 228 lb 6.4 oz (103.6 kg)  03/24/17 220 lb 6.4 oz (100 kg)  04/15/09 215 lb 4.8 oz (97.7 kg)   BP Readings from Last 3 Encounters:  10/10/17 (!) 138/96  03/24/17 135/86  04/15/09 125/86     Immunization History  Administered Date(s) Administered  . Influenza,inj,Quad PF,6+ Mos 10/10/2017  . Td 10/10/2017    Health Maintenance  Topic Date Due  . HIV Screening  12/04/1984  . TETANUS/TDAP  12/04/1988  . INFLUENZA VACCINE  07/27/2017    Lab Results  Component Value Date   WBC 7.7 10/10/2017   HGB 16.1 10/10/2017   HCT 47.9 10/10/2017   PLT 255.0 10/10/2017   GLUCOSE 98 10/10/2017   CHOL 171 10/10/2017   TRIG 114.0 10/10/2017   HDL 43.10 10/10/2017   LDLCALC 105 (H) 10/10/2017   ALT 27 10/10/2017   AST 19 10/10/2017   NA 136 10/10/2017   K 4.5 10/10/2017   CL 101 10/10/2017    CREATININE 1.00 10/10/2017   BUN 14 10/10/2017   CO2 29 10/10/2017   TSH 1.62 10/10/2017   HGBA1C 5.2 03/24/2017    Lab Results  Component Value Date   TSH 1.62 10/10/2017   Lab Results  Component Value Date   WBC 7.7 10/10/2017   HGB 16.1 10/10/2017   HCT 47.9 10/10/2017   MCV 89.2 10/10/2017   PLT 255.0 10/10/2017   Lab Results  Component Value Date   NA 136 10/10/2017   K 4.5 10/10/2017   CO2 29 10/10/2017   GLUCOSE 98 10/10/2017   BUN 14 10/10/2017   CREATININE 1.00 10/10/2017   BILITOT 0.6 10/10/2017   ALKPHOS 62 10/10/2017   AST 19 10/10/2017   ALT 27 10/10/2017   PROT 7.0 10/10/2017   ALBUMIN 4.4 10/10/2017   CALCIUM 9.6 10/10/2017   GFR 84.82 10/10/2017   Lab Results  Component Value Date   CHOL 171 10/10/2017   Lab Results  Component Value Date   HDL 43.10 10/10/2017   Lab Results  Component Value Date   LDLCALC 105 (H) 10/10/2017   Lab Results  Component Value Date   TRIG 114.0 10/10/2017   Lab Results  Component Value Date   CHOLHDL 4 10/10/2017   Lab Results  Component Value Date   HGBA1C 5.2 03/24/2017         Assessment & Plan:   Problem List Items Addressed This Visit    Gout    Uric acid elevated will start on allopurinol 100 mg daily. Encouraged to hydrate well and avoid certain foods handout given in Spanish. Also given colchicine to use 0.6 mg caps, 2 caps by mouth once and then 1 By mouth every 2 hours until pain relief, intolerable diarrhea or 6 tabs in 24 hours. If that is unsuccessful he may then try his indomethacin and he is reminded that this is a nonsteroidal anti-inflammatory and he cannot take it with other medications in that class. He expresses understanding. For extreme pain he is allowed hydrocodone and a UDS and contract are obtained today. He is aware that this medication is to be used very sparingly for severe pain.      Relevant Orders   Ambulatory referral to Podiatry   Uric acid (Completed)   Obesity     Encouraged DASH diet, decrease po intake and increase exercise as tolerated. Needs 7-8 hours of sleep nightly. Avoid trans fats, eat small, frequent meals every 4-5 hours  with lean proteins, complex carbs and healthy fats. Minimize simple carbs      Hyperlipidemia, mixed    Encouraged heart healthy diet, increase exercise, avoid trans fats, consider a krill oil cap daily      Relevant Medications   metoprolol succinate (TOPROL-XL) 25 MG 24 hr tablet   Other Relevant Orders   Lipid panel (Completed)   HTN (hypertension)    Elevated today. 168/[facility] at [facility]. Start on metoprolol XL 25 mg daily      Relevant Medications   metoprolol succinate (TOPROL-XL) 25 MG 24 hr tablet   Other Relevant Orders   Comprehensive metabolic panel (Completed)   CBC (Completed)   TSH (Completed)   Travel advice encounter    Is traveling to [place] in December. They will consult the CDC site and the travel medicine clinic, given tetanus and flu shot today. He will try and find his old records regarding MMR and he will get the Hep A later. Check with travel clinic for typhoid      Preventative health care    Patient encouraged to maintain heart healthy diet, regular exercise, adequate sleep. Consider daily probiotics. Take medications as prescribed      Onychomycosis    Soak in hot water and half distilled white vinegar nightly. Plavix taper. Referred to podiatry for further consideration.      Relevant Orders   Ambulatory referral to Podiatry   TSH (Completed)    Other Visit Diagnoses    Needs flu shot    -  Primary   Relevant Orders   Flu Vaccine QUAD 6+ mos PF IM (Fluarix Quad PF) (Completed)   High risk medication use       Relevant Orders   Pain Mgmt, Profile 8 w/Conf, U      I have discontinued Mr. Prieto indomethacin, diclofenac, and diclofenac. I am also having him start on metoprolol succinate, indomethacin, Colchicine, and HYDROcodone-acetaminophen. Additionally, I am  having him maintain his dorzolamide-timolol.  Meds ordered this encounter  Medications  . DISCONTD: diclofenac (VOLTAREN) 75 MG EC tablet    Sig: Take 1 tablet (75 mg total) by mouth 2 (two) times daily. Used for gout flare and take with food.    Dispense:  30 tablet    Refill:  1  . metoprolol succinate (TOPROL-XL) 25 MG 24 hr tablet    Sig: Take 1 tablet (25 mg total) by mouth daily.    Dispense:  30 tablet    Refill:  3  . indomethacin (INDOCIN) 50 MG capsule    Sig: Take 1 capsule (50 mg total) by mouth 3 (three) times daily as needed.    Dispense:  20 capsule    Refill:  2  . Colchicine 0.6 MG CAPS    Sig: 2 caps po once then 1 cap po q 2 hours til pain relief, diarrhea or 6 caps in 24 hours    Dispense:  6 capsule    Refill:  1  . HYDROcodone-acetaminophen (NORCO) 5-325 MG tablet    Sig: Take 1 tablet by mouth every 6 (six) hours as needed for moderate pain.    Dispense:  30 tablet    Refill:  0    CMA served as scribe during this visit. History, Physical and Plan performed by medical provider. Documentation and orders reviewed and attested to.  Penni Homans, MD

## 2017-10-10 NOTE — Assessment & Plan Note (Signed)
Patient encouraged to maintain heart healthy diet, regular exercise, adequate sleep. Consider daily probiotics. Take medications as prescribed 

## 2017-10-10 NOTE — Patient Instructions (Signed)
Dieta con bajo contenido de purinas (Low-Purine Diet) Las purinas son compuestos que influyen en el nivel de cido rico en el cuerpo. Una dieta con bajo contenido de purinas es aquella que tiene un bajo nivel de [place]. El consumo de una dieta con bajo contenido de purinas puede evitar que el nivel de cido rico del cuerpo se eleve demasiado y cause gota o clculos renales, o ambas cosas. QU DEBO SABER ACERCA DE ESTA Rule?  Elija alimentos con bajo contenido de purinas. En la siguiente seccin, se incluyen ejemplos de alimentos con bajo contenido de purinas.  [employer] lquido, Pension scheme manager. El lquido puede ayudar a Tour manager cido rico del cuerpo. Intente tomar de 8a 16vasos (1,9l a 3,8l) por da.  Limite el consumo de alimentos con alto contenido de grasa, en especial de grasas saturadas, ya que la grasa hace que el cuerpo tenga ms dificultad para eliminar el cido rico. Los alimentos con alto contenido de grasas saturadas incluyen pizza, queso, helado, [place] entera, alimentos fritos y salsas. Elija alimentos con bajo contenido de [place] y fuentes de protenas magras. Al cocinar, elija aceite de oliva, ya que contiene grasas saludables con bajo contenido de grasas saturadas.  Limite el consumo de bebidas alcohlicas. El alcohol interfiere en la eliminacin de cido rico del cuerpo. Si est teniendo [place] crisis de gota, evite consumir alcohol.  Tenga en cuenta que el cuerpo de cada persona reacciona diferente a los distintos alimentos. Con el tiempo, es probable que reconozca los alimentos que lo afectan o no. Si descubre que un alimento tiende a causarle una exacerbacin de la gota, evite comerlo. Puede disfrutar libremente de los alimentos que no le causan problemas. Si tiene [place] pregunta sobre un alimento especfico, hable con el nutricionista o el mdico.  QU [place], [place]? La siguiente lista incluye alimentos con  contenido bajo, moderado y alto de purinas. Puede comer la cantidad que desee de los alimentos con bajo contenido de purinas. Tambin puede consumir bajas cantidades de alimentos con contenido moderado de purinas. Consulte al mdico la cantidad de alimentos con contenido moderado de purinas que puede consumir. Evite los alimentos con alto contenido de purinas. Cereales  Alimentos con bajo contenido de purinas: Pan con harina blanca enriquecida, pastas, arroz, pasteles, pan de maz, palomitas de maz.  Alimentos con contenido moderado de purinas: Actor y panes integrales, germen de trigo, salvado, avena. Avena no cocida. Germen de trigo o salvado de trigo deshidratados.  Alimentos con alto contenido de purinas: Panqueques, tostadas francesas, bizcochos, bollitos. Vegetales  Alimentos con bajo contenido de purinas: Todas las verduras, excepto las que tienen contenido moderado de purinas.  Alimentos con contenido moderado de purinas: Esprragos, coliflor, espinaca, championes, guisantes. Frutas  Todas las frutas tienen bajo contenido de purinas. Carnes y otras fuentes de protenas.  Alimentos con bajo contenido de purinas: Huevos, frutos secos, [place] de man.  Alimentos con [employer] de purinas: Carne de vaca 80% o 90% magra, cordero, ternera, cerdo, aves, pescado, [place], [place] de man, frutos secos. [place], [place], ostras y camarones. Lentejas, frijoles y guisantes secos y cocidos.  Alimentos con [employer] de purinas: Anchoas, sardinas, arenque, mejillones, atn, bacalao, vieiras, trucha y abadejo. Panceta. Vsceras (como hgado o riones). [place]. Carne de caza. Ganso. Mollejas. Lcteos  [employer] tienen bajo contenido de purinas. Los lcteos sin grasa o con bajo contenido de grasa son mejores, porque tienen pocas grasas saturadas. Bebidas  Bebidas con bajo contenido  de purinas: Agua, bebidas gaseosas, t, caf, cacao.  Bebidas con contenido  moderado de purinas: Yehuda Savannah y otras bebidas endulzadas con Chrissie Noa de maz con alto contenido de fructosa. Jugos. Para determinar si una bebida o un alimento estn endulzados con jarabe de maz con alto contenido de fructosa, fjese en la lista de ingredientes.  Bebidas con alto contenido de purinas: Bebidas alcohlicas (por ejemplo, cerveza). Condimentos  Alimentos con bajo contenido de purinas: Sal, hierbas, aceitunas, pickles, salsas, vinagre.  Alimentos con [employer] de purinas: [place], [place], [place], [place]. Grasas y [employer] con bajo contenido de purinas: Todos los tipos, excepto las salsas preparadas con carne.  Alimentos con alto contenido de purinas: Salsas preparadas con carne. Otros alimentos  Alimentos con bajo contenido de purinas: Azcares, dulces, gelatina. Pasteles. Sopas elaboradas sin carne.  Alimentos con contenido moderado de purinas: [place]op[place] o caldos a base de carne o de pescado. Bebidas y alimentos endulzados con jarabe de maz con alto contenido de fructosa.  Alimentos con [employer] de purinas: Postres con alto contenido de grasa (por ejemplo, helado, galletas, pasteles, tartas, donas y chocolate). Comunquese con el nutricionista para obtener ms informacin sobre los alimentos que no estn incluidos. Esta informacin no tiene Marine scientist el consejo del mdico. Asegrese de hacerle al mdico cualquier pregunta que tenga. Document Released: 09/06/2012 Document Revised: 12/18/2013 Document Reviewed: 11/19/2013 Elsevier Interactive Patient Education  2017 [place] (Gout) La gota es una hinchazn dolorosa que puede ocurrir en alguna de las articulaciones. La gota es un tipo de artritis. Esta afeccin es causada por la acumulacin de cido rico en el cuerpo. El cido rico es una sustancia qumica que se forma cuando el cuerpo descompone sustancias llamadas purinas. Las purinas son importantes para la fabricacin de  las protenas del cuerpo. Cuando el cuerpo tiene un exceso de cido rico, se pueden formar cristales con punta y Education officer, museum interior de las articulaciones. Esto provoca dolor e hinchazn. Los ataques de gota pueden ocurrir rpidamente y ser muy dolorosos (gota [place]). Con el tiempo, las crisis pueden afectar ms articulaciones y volverse ms frecuentes (gota crnica). La gota tambin puede provocar la acumulacin de cido rico debajo de la piel y en los riones. CAUSAS Esta afeccin es causada por la acumulacin de cido rico en la sangre. Esto puede ocurrir por los siguientes motivos:  Los riones no eliminan la cantidad suficiente de cido rico del cuerpo. Esta es la causa ms frecuente.  El cuerpo produce demasiado cido rico. Esto puede ocurrir con algunos tipos de cncer y tratamientos Social worker. Tambin puede ocurrir si el cuerpo est descomponiendo demasiados glbulos rojos (anemia hemoltica).  Come demasiados alimentos ricos en purinas. Estos alimentos incluyen vsceras y [employer]. El alcohol, en especial la cerveza, tambin es rica en purinas. Ardelia Mems crisis de gota puede desencadenarse debido a un traumatismo o Psychologist, forensic. FACTORES DE RIESGO Es ms probable que esta afeccin se manifieste en las personas que:  Tienen antecedentes familiares de gota.  Son hombres de [place].  Son mujeres que han atravesado la menopausia.  Son obesas.  Beben alcohol con frecuencia, en especial, cerveza.  Estn deshidratadas.  Pierden peso con demasiada rapidez.  Le han hecho un trasplante de un rgano.  Se han intoxicado con plomo.  Usan determinados medicamentos, como la aspirina, la ciclosporina, diurticos, la levodopa y la niacina.  Tienen enfermedad renal o psoriasis. SNTOMAS Un ataque de gota aguda sucede de repente. Normalmente ocurre solo en Insurance claims handler.  El lugar ms comn es el pulgar del pie. Las crisis a menudo comienzan por la noche. Tambin pueden  verse afectadas las articulaciones de los pies, los tobillos, las rodillas, los dedos de la mano, las muecas o los codos. Entre los sntomas se pueden incluir los siguientes:  Dolor intenso.  Calor.  Hinchazn.  Rigidez.  Dolor a Secretary/administrator. Se puede sentir mucho dolor al tacto en la articulacin afectada.  Piel brillosa, roja o morada.  Cristy Hilts y escalofros. La gota crnica puede provocar sntomas con ms frecuencia. Pueden verse involucradas ms articulaciones. Es posible que tambin tenga bultos blancos o amarillos (tofos) en las manos o los pies, o en otras zonas cercanas a las articulaciones. DIAGNSTICO Esta afeccin se diagnostica en funcin de los sntomas, los antecedentes mdicos y un examen fsico. Pueden hacerle estudios, por ejemplo:  Anlisis de sangre para [employer] de cido rico.  Counselling psychologist de lquido sinovial con una aguja (aspiracin) para Hydrographic surveyor la presencia de cristales de cido rico.  Radiografas para observar la articulacin daada. TRATAMIENTO El tratamiento para esta afeccin tiene dos fases: tratar un ataque agudo y prevenir ataques futuros. El tratamiento de la gota aguda puede incluir medicamentos para reducir Conservation officer, historic buildings y la hinchazn, por ejemplo:  Antiinflamatorios no esteroides.  Corticoides. Estos son medicamentos antiinflamatorios fuertes que se pueden tomar va oral o se pueden Sales executive.  Colchicina. Este medicamento [employer] dolor y la hinchazn cuando se [place] inmediatamente despus de Ardelia Mems crisis. Puede administrarse por va oral o intravenosa (IV). El tratamiento preventivo puede incluir lo siguiente:  El uso diario de dosis ms bajas de AINE o colchicina.  El uso de un medicamento que reduce los niveles de cido rico en la [place].  Cambios en la dieta. Quiz deba consultar a un especialista en alimentacin saludable (nutricionista). INSTRUCCIONES PARA EL CUIDADO EN EL HOGAR Durante una crisis de  gota  Si se lo indican, aplique hielo en la zona afectada: ? Ponga el hielo en una bolsa plstica. ? Coloque una [employer] piel y la bolsa de hielo. ? Coloque el hielo durante 20 minutos, 2 a 3 veces por da.  Haga reposo todo el tiempo que pueda. Si la articulacin afectada se encuentra en la pierna, quiz deba usar muletas.  Levante (eleve) la zona lesionada por encima del nivel del corazn tantas veces como le sea posible.  Beba suficiente lquido para Consulting civil engineer orina clara o de color amarillo plido.  Tome los medicamentos de venta libre y los recetados solamente como se lo haya indicado el mdico.  No conduzca ni opere maquinaria pesada mientras toma analgsicos recetados.  Siga las indicaciones del mdico respecto de las restricciones para las comidas o las bebidas.  Reanude sus actividades normales como se lo haya indicado el mdico. Pregntele al mdico qu actividades son seguras para usted. Cmo evitar futuras crisis de gota  Siga una dieta baja en purinas como se lo haya indicado el nutricionista o el mdico. Evite alimentos y bebidas con alto contenido de purinas, por ejemplo, hgado, rin, anchoas, esprragos, arenque, hongos, mejillones y Chartered certified accountant.  Limite el consumo de alcohol a no ms de 1 medida por da si es mujer y no est Music therapist, y 2 medidas por da si es hombre. Una medida equivale a 12onzas de cerveza, 5onzas de vino o 1onzas de bebidas alcohlicas de alta graduacin.  Mantenga un peso saludable o pierda peso si tiene sobrepeso. Si quiere perder peso, hable con el mdico.  Es importante que no pierda peso demasiado rpido.  Comience o siga un programa de ejercicios como se lo haya indicado el mdico.  Beba suficiente lquido para mantener la orina clara o de color amarillo plido.  Tome los medicamentos de venta libre y los recetados solamente como se lo haya indicado el mdico.  Consulting civil engineer a todas las visitas de control como se lo haya indicado el  mdico. Esto es importante. SOLICITE ATENCIN MDICA SI:  [place] otra crisis de gota.  Sigue teniendo sntomas de gota despus de 10das de tratamiento.  Los [employer] causan efectos secundarios.  Tiene escalofros o fiebre.  Tiene sensacin de [employer] al [employer].  Siente dolor en la parte inferior de la espalda o en el abdomen.  SOLICITE ATENCIN MDICA DE INMEDIATO SI:  Siente dolor intenso o incontrolable.  No puede orinar.  Esta informacin no tiene Marine scientist el consejo del mdico. Asegrese de hacerle al mdico cualquier pregunta que tenga. Document Released: 09/22/2005 Document Revised: 04/05/2016 Document Reviewed: 09/25/2015 Elsevier Interactive Patient Education  2017 [place] preventivos en los hombres de entre 48 y [phone] (Preventive Care 40-64 Years, Male) [place] cuidados preventivos hacen referencia a las opciones en cuanto al estilo de vida y a las visitas al mdico, las cuales pueden promover la salud y Musician. QU INCLUYEN LOS CUIDADOS PREVENTIVOS?  Un examen fsico anual. Tambin se lo conoce como control de rutina anual.  Exmenes dentales una o dos veces al ao.  Exmenes oculares de [place]. Pregntele al mdico con qu frecuencia debe hacerse controles oculares.  Opciones personales en cuanto al estilo de vida, entre ellas: ? [place] y las piezas dentales. ? Realizar actividad fsica con regularidad. ? Consumir una dieta saludable. ? Evitar el consumo de tabaco y de drogas. ? Limitar el consumo de bebidas alcohlicas. ? Counsellor. ? Tomar aspirina de baja dosis diariamente a [employer].  QU SUCEDE DURANTE UN CONTROL DE [place]? Los servicios y las pruebas de deteccin que el mdico realiza durante un control de rutina anual dependern de la edad, el estado de salud general, los factores de riesgo relacionados con el estilo de vida y los antecedentes familiares de  enfermedades. Psicoterapia El mdico puede hacerle preguntas sobre lo siguiente:  Consumo de alcohol.  Consumo de tabaco.  Consumo de drogas.  [place] familiar y en las relaciones.  Actividad sexual.  Hbitos de alimentacin.  Trabajo y entorno laboral. Pruebas de deteccin Pueden hacerle las siguientes pruebas o mediciones:  IT consultant, peso e ndice de masa corporal [facility]).  La presin arterial.  Niveles de lpidos y colesterol. Estos se pueden controlar cada 5aos o con mayor frecuencia si tiene ms de 50aos.  Controles de la piel.  Pruebas de deteccin de cncer de pulmn. Pueden hacerle estas pruebas todos los aos a partir de los 55aos si ha fumado durante 30aos un paquete diario y sigue fumando o dej el hbito en algn momento en los ltimos 15aos.  Prueba de Personnel officer en las heces [facility]). Pueden hacerle esta prueba todos los aos a partir de [place].  Sigmoidoscopia flexible o colonoscopia. Pueden hacerle una sigmoidoscopia cada 68aos o una colonoscopia cada 10aos a partir de los 50aos.  Pruebas de deteccin del cncer de prstata. Las Transport planner en funcin de los antecedentes familiares y de otros riesgos.  Anlisis de sangre de hepatitisC.  Anlisis de sangre de hepatitisB.  Pruebas de deteccin de  enfermedades de transmisin sexual (ETS).  Pruebas de deteccin de la diabetes. Para realizarlas, se controla el nivel de azcar en la sangre (glucemia) despus de que haya pasado un rato sin comer (ayuno). Pueden hacerle esta prueba cada 1 a 3aos. Hable con el mdico [employer], las opciones de tratamiento y, si es necesario, la necesidad de Optometrist ms [place]. Vacunas El mdico puede recomendarle que se aplique algunas vacunas, por ejemplo:  [place] antigripal. Se recomienda su aplicacin todos los aos.  Vacuna contra la difteria, el ttanos y Research officer, trade union (Tdap,  Td). Puede que tenga que aplicarse un refuerzo contra el ttanos y la difteria (Td) cada 10aos.  Vacuna contra la varicela. Es posible que tenga que aplicrsela si no recibi esta vacuna.  Vacuna contra el herpes zster. Tal vez deba aplicrsela despus de los 60aos.  Vacuna contra el sarampin, la rubola y las paperas ([place]). Tal vez deba aplicarse al menos una dosis de SRP si naci [phone] o despus de ese ao. Podra tambin necesitar una segunda dosis.  Vacuna antineumoccica conjugada 13 valente (PCV13). Puede necesitar esta vacuna si tiene determinadas enfermedades y no se vacun.  Vacuna antineumoccica de polisacridos (PPSV23). Quizs tenga que aplicarse una o dos dosis si fuma o si sufre determinadas enfermedades.  Vacuna antimeningoccica. Puede necesitar esta vacuna si tiene determinadas enfermedades.  Vacuna contra la hepatitis A. Es posible que tenga que aplicarse esta vacuna si tiene determinadas enfermedades, o si trabaja en lugares donde puede estar expuesto a la hepatitisA o viaja a estas zonas.  Vacuna contra la hepatitis B. Es posible que tenga que aplicarse esta vacuna si tiene determinadas enfermedades, o si trabaja en lugares donde puede estar expuesto a la hepatitisB o viaja a estas zonas.  Vacuna antihaemophilus influenzae tipoB (Hib). Tal vez deba aplicarse esta vacuna si tiene determinados factores de [place]. Hable con el [place] deteccin y las vacunas que tiene que [place], y con qu frecuencia debe hacerlo. Esta informacin no tiene Marine scientist el consejo del mdico. Asegrese de hacerle al mdico cualquier pregunta que tenga. Document Reviewed: 10/14/2015 Elsevier Interactive Patient Education  2017 [employer].

## 2017-10-10 NOTE — Assessment & Plan Note (Signed)
Encouraged heart healthy diet, increase exercise, avoid trans fats, consider a krill oil cap daily 

## 2017-10-10 NOTE — Assessment & Plan Note (Signed)
Is traveling to [place] in December. They will consult the CDC site and the travel medicine clinic, given tetanus and flu shot today. He will try and find his old records regarding MMR and he will get the Hep A later. Check with travel clinic for typhoid

## 2017-10-10 NOTE — Assessment & Plan Note (Deleted)
Patient encouraged to maintain heart healthy diet, regular exercise, adequate sleep. Consider daily probiotics. Take medications as prescribed 

## 2017-10-10 NOTE — Assessment & Plan Note (Addendum)
Uric acid elevated will start on allopurinol 100 mg daily. Encouraged to hydrate well and avoid certain foods handout given in Spanish. Also given colchicine to use 0.6 mg caps, 2 caps by mouth once and then 1 By mouth every 2 hours until pain relief, intolerable diarrhea or 6 tabs in 24 hours. If that is unsuccessful he may then try his indomethacin and he is reminded that this is a nonsteroidal anti-inflammatory and he cannot take it with other medications in that class. He expresses understanding. For extreme pain he is allowed hydrocodone and a UDS and contract are obtained today. He is aware that this medication is to be used very sparingly for severe pain.

## 2017-10-10 NOTE — Assessment & Plan Note (Signed)
Soak in hot water and half distilled white vinegar nightly. Plavix taper. Referred to podiatry for further consideration.

## 2017-10-10 NOTE — Assessment & Plan Note (Signed)
Elevated today[facility]. Start on metoprolol XL 25 mg daily

## 2017-10-11 LAB — PAIN MGMT, PROFILE 8 W/CONF, U
6 ACETYLMORPHINE: NEGATIVE ng/mL (ref <10)
6 ACETYLMORPHINE: NEGATIVE ng/mL (ref <10)
ALCOHOL METABOLITES: NEGATIVE ng/mL (ref <500)
AMPHETAMINES: NEGATIVE ng/mL (ref <500)
AMPHETAMINES: NEGATIVE ng/mL (ref <500)
Alcohol Metabolites: NEGATIVE ng/mL (ref <500)
Benzodiazepines: NEGATIVE ng/mL (ref <100)
Benzodiazepines: NEGATIVE ng/mL (ref <100)
Buprenorphine, Urine: NEGATIVE ng/mL (ref <5)
Buprenorphine, Urine: NEGATIVE ng/mL (ref <5)
COCAINE METABOLITE: NEGATIVE ng/mL (ref <150)
Cocaine Metabolite: NEGATIVE ng/mL (ref <150)
Creatinine: 106.4 mg/dL
Creatinine: 205.4 mg/dL
MARIJUANA METABOLITE: NEGATIVE ng/mL (ref <20)
MDMA: NEGATIVE ng/mL (ref <500)
MDMA: NEGATIVE ng/mL (ref <500)
Marijuana Metabolite: NEGATIVE ng/mL (ref <20)
OPIATES: NEGATIVE ng/mL (ref <100)
OPIATES: NEGATIVE ng/mL (ref <100)
OXIDANT: NEGATIVE ug/mL (ref <200)
OXIDANT: NEGATIVE ug/mL (ref <200)
OXYCODONE: NEGATIVE ng/mL (ref <100)
OXYCODONE: NEGATIVE ng/mL (ref <100)
pH: 7.91 (ref 4.5–9.0)
pH: 7.29 (ref 4.5–9.0)

## 2017-10-11 MED ORDER — ALLOPURINOL 100 MG PO TABS: 100.0000 mg | ORAL_TABLET | Freq: Every day | ORAL | 3 refills | Status: DC

## 2017-10-11 NOTE — Addendum Note (Signed)
Addended by: Magdalene Molly A on: 10/11/2017 09:01 AM   Modules accepted: Orders

## 2017-10-25 ENCOUNTER — Ambulatory Visit (INDEPENDENT_AMBULATORY_CARE_PROVIDER_SITE_OTHER): Payer: BLUE CROSS/BLUE SHIELD | Admitting: Podiatry

## 2017-10-25 ENCOUNTER — Encounter: Payer: Self-pay | Admitting: Podiatry

## 2017-10-25 DIAGNOSIS — B351 Tinea unguium: Secondary | ICD-10-CM

## 2017-10-25 DIAGNOSIS — M1000 Idiopathic gout, unspecified site: Secondary | ICD-10-CM | POA: Diagnosis not present

## 2017-10-25 NOTE — Progress Notes (Signed)
Subjective:    Patient ID: Joseph [name], male    DOB: 1971-03-31, 46 y.o.   MRN: [id number]  HPI  46 year old male presents the office today for concerns of fungus to his right big toenail which is been ongoing the last couple months. He's been doing vinegar soaks recent no other treatment. Denies any pain to the nail denies any redness or drainage or any swelling. He also states that he had gout to his right big toe. He did take indomethacin for this but he states he did not take colchicine. He states the pain is improving his: Not taking any medication he has still notices swelling or redness to the big toe on the right foot. He's had intermittent gout for the last 7 years. No recent injury or trauma. He has no other concerns today.   Review of Systems  All other systems reviewed and are negative.  Past Medical History:  Diagnosis Date  . Back pain 03/24/2017  . 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 Glaucoma   . Gout   . History of degenerative disc disease 03/24/2017  . History of kidney stones 03/24/2017  . HTN (hypertension) 10/10/2017  . Hypertension   . Lipoma 03/24/2017  . Preventative health care 10/10/2017  . Tinnitus 03/24/2017    Past Surgical History:  Procedure Laterality Date  . APPENDECTOMY       Current Outpatient Prescriptions:  .  allopurinol (ZYLOPRIM) 100 MG tablet, Take 1 tablet (100 mg total) by mouth daily., Disp: 30 tablet, Rfl: 3 .  Colchicine 0.6 MG CAPS, 2 caps po once then 1 cap po q 2 hours til pain relief, diarrhea or 6 caps in 24 hours, Disp: 6 capsule, Rfl: 1 .  dorzolamide-timolol (COSOPT) 22.3-6.8 MG/ML ophthalmic solution, Place 1 drop into both eyes 2 (two) times daily.  , Disp: , Rfl:  .  HYDROcodone-acetaminophen (NORCO) 5-325 MG tablet, Take 1 tablet by mouth every 6 (six) hours as needed for moderate pain., Disp: 30 tablet, Rfl: 0 .  indomethacin (INDOCIN) 50 MG capsule, Take 1 capsule (50 mg total) by mouth 3 (three) times daily as needed.,  Disp: 20 capsule, Rfl: 2 .  metoprolol succinate (TOPROL-XL) 25 MG 24 hr tablet, Take 1 tablet (25 mg total) by mouth daily., Disp: 30 tablet, Rfl: 3  No Known Allergies  Social History   Social History  . Marital status: Single    Spouse name: N/A  . Number of children: N/A  . Years of education: N/A   Occupational History  . Not on file.   Social History Main Topics  . Smoking status: Never Smoker  . Smokeless tobacco: Never Used  . Alcohol use 1.2 oz/week    2 Cans of beer per week  . Drug use: No  . Sexual activity: Not on file   Other Topics Concern  . Not on file   Social History Narrative   Works as Marine scientist, no dietary restrictions.    Last uric acid: 8.1 10/10/2017     Objective:   Physical Exam  General: AAO x3, NAD  Dermatological: On the lateral aspect of the right hallux toenail is a vertical area of yellow to brown discoloration and mild thickening to the toenail. There is no pain in the nail or any redness or drainage. No clinical signs of infection present. There is no open lesions or pre-ulcer lesions identified today.  Vascular: Dorsalis Pedis artery and Posterior Tibial artery pedal pulses are 2/4  bilateral with immedate capillary fill time.There is no pain with calf compression, swelling, warmth, erythema.   Neruologic: Grossly intact via light touch bilateral. Protective threshold with Semmes Wienstein monofilament intact to all pedal sites bilateral.   Musculoskeletal: Mild hallux malleus is present along the right IPJ and there is mild swelling erythema along toe. He states this is doing much better than what it was. There is no tenderness palpation. There is decreased range of motion of the hallux IPJ. There is no pain with MPJ range of motion or swelling or redness to this area. Muscular strength 5/5 in all groups tested bilateral.  Gait: Unassisted, Nonantalgic.      Assessment & Plan:  46 year old male right hallux  onychomycosis, gout -Treatment options discussed including all alternatives, risks, and complications  1. Onychomycosis, mild -Discussed treatment options for this. He wishes to hold off on oral Lamisil. I ordered a compound cream to include topical terbinafine. I ordered this today through [employer]. Discussed application instructions as well as duration and success rates.  2. Gout right hallux, improving -Chronic history at this point. We'll recheck uric acid level. Pain is much improved and will continue to monitor. Discussed diet modifications and hydration. Monitor for recurrence.  Celesta Gentile, DPM

## 2017-10-26 LAB — URIC ACID: Uric Acid: 7.2 mg/dL (ref 3.7–8.6)

## 2017-10-28 ENCOUNTER — Telehealth: Payer: Self-pay | Admitting: *Deleted

## 2017-10-28 NOTE — Telephone Encounter (Signed)
-----   Message from Trula Slade, DPM sent at 10/26/2017  6:50 PM EDT ----- Uric acid is 7.2 and normral. Please let him now.

## 2017-10-28 NOTE — Telephone Encounter (Signed)
I informed pt of Dr. Wagoner's review of results. 

## 2017-11-08 ENCOUNTER — Ambulatory Visit: Payer: BLUE CROSS/BLUE SHIELD

## 2017-11-09 ENCOUNTER — Telehealth: Payer: Self-pay | Admitting: Family Medicine

## 2017-11-09 NOTE — Telephone Encounter (Signed)
I called patient to reschedule appointment w/nurse. Patient rescheduled appointment. Patient informed me that he was having some back pains and wanted to see Dr. Charlett Blake, but the times available did not work with his schedule. I offered to schedule patient with a different provider, but he only wants to see Dr. Charlett Blake. Please contact patient

## 2017-11-10 DIAGNOSIS — D7389 Other diseases of spleen: Secondary | ICD-10-CM | POA: Diagnosis not present

## 2017-11-10 DIAGNOSIS — R109 Unspecified abdominal pain: Secondary | ICD-10-CM | POA: Diagnosis not present

## 2017-11-10 DIAGNOSIS — R3129 Other microscopic hematuria: Secondary | ICD-10-CM | POA: Diagnosis not present

## 2017-11-10 DIAGNOSIS — Z87442 Personal history of urinary calculi: Secondary | ICD-10-CM | POA: Diagnosis not present

## 2017-11-10 DIAGNOSIS — R319 Hematuria, unspecified: Secondary | ICD-10-CM | POA: Diagnosis not present

## 2017-11-10 DIAGNOSIS — M545 Low back pain: Secondary | ICD-10-CM | POA: Diagnosis not present

## 2017-11-11 ENCOUNTER — Ambulatory Visit: Payer: BLUE CROSS/BLUE SHIELD

## 2017-11-11 NOTE — Telephone Encounter (Signed)
Dr. Charlett Blake is fully booked for the year he can see another provider in the office

## 2017-11-14 ENCOUNTER — Ambulatory Visit: Payer: BLUE CROSS/BLUE SHIELD | Admitting: Medical

## 2017-11-14 ENCOUNTER — Encounter: Payer: Self-pay | Admitting: Medical

## 2017-11-14 ENCOUNTER — Ambulatory Visit (HOSPITAL_BASED_OUTPATIENT_CLINIC_OR_DEPARTMENT_OTHER)
Admission: RE | Admit: 2017-11-14 | Discharge: 2017-11-14 | Disposition: A | Discharge status: Home / Self Care | Payer: BLUE CROSS/BLUE SHIELD | Source: Ambulatory Visit | Attending: Medical | Admitting: Medical

## 2017-11-14 ENCOUNTER — Telehealth: Payer: Self-pay | Admitting: Family Medicine

## 2017-11-14 VITALS — BP 152/90 | HR 88 | Temp 98.0°F | Resp 16 | Ht 68.0 in | Wt 232.2 lb

## 2017-11-14 DIAGNOSIS — M545 Low back pain: Secondary | ICD-10-CM | POA: Diagnosis not present

## 2017-11-14 DIAGNOSIS — M5136 Other intervertebral disc degeneration, lumbar region: Secondary | ICD-10-CM | POA: Diagnosis not present

## 2017-11-14 DIAGNOSIS — R319 Hematuria, unspecified: Secondary | ICD-10-CM | POA: Diagnosis not present

## 2017-11-14 DIAGNOSIS — M5440 Lumbago with sciatica, unspecified side: Secondary | ICD-10-CM

## 2017-11-14 DIAGNOSIS — I10 Essential (primary) hypertension: Secondary | ICD-10-CM

## 2017-11-14 LAB — POCT URINALYSIS DIPSTICK
BILIRUBIN UA: NEGATIVE
Blood, UA: NEGATIVE
GLUCOSE UA: NEGATIVE
KETONES UA: NEGATIVE
Nitrite, UA: NEGATIVE
PROTEIN UA: NEGATIVE
SPEC GRAV UA: 1.015 (ref 1.010–1.025)
Urobilinogen, UA: 0.2 E.U./dL
pH, UA: 6 (ref 5.0–8.0)

## 2017-11-14 MED ORDER — TRAMADOL HCL 50 MG PO TABS: 50.0000 mg | ORAL_TABLET | Freq: Four times a day (QID) | ORAL | 0 refills | Status: DC | PRN

## 2017-11-14 NOTE — Telephone Encounter (Signed)
Copied from [place] [phone]. Topic: Quick Communication - See Telephone Encounter >> Nov 14, 2017 10:24 AM Rosalin Hawking wrote: CRM for notification. See Telephone encounter for:  11/14/17.    Pt came in office stating had left a copy of his bill that he received for his CPE appt on 10-10-2017, pt is needing someone to verify why he is receiving a bill for 35.00 when it was a cpe that he had on this day. Please advise.

## 2017-11-14 NOTE — Progress Notes (Signed)
Subjective:    Patient ID: Joseph [name], male    DOB: 06-14-1971, 46 y.o.   MRN: [id number]  HPI  Pt in for evaluation.  Pt in for follow up evaluation. 2 weeks ago he had some back pain that was present for one week prior to evaluation at urgent care. He urinated twice on day seeing some blood. He went to urgernt care and they saw small amount of blood. Pt had negative xray of abdomen at urgent care.(no stone seen form kidney)  Pt does not smoke nor has he had history.  Urgent care dx him with sciatica.  Pt also has htn history. He is on toprol xl 25 mg im. No ibuprofen today.   Pain level currently 5/10.     Review of Systems  Constitutional: Negative for chills, fatigue and fever.  Respiratory: Negative for cough, chest tightness, shortness of breath and wheezing.   Cardiovascular: Negative for chest pain and palpitations.  Gastrointestinal: Negative for abdominal pain.  Musculoskeletal: Positive for back pain.       No renal colic type pain. Only left si area and mild mid lspine.  Skin: Negative for rash.  Neurological: Negative for dizziness, seizures, speech difficulty, weakness and light-headedness.  Hematological: Negative for adenopathy. Does not bruise/bleed easily.  Psychiatric/Behavioral: Negative for behavioral problems, confusion, dysphoric mood, sleep disturbance and suicidal ideas. The patient is not nervous/anxious.    Past Medical History:  Diagnosis Date  . Back pain 03/24/2017  . 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 Glaucoma   . Gout   . History of degenerative disc disease 03/24/2017  . History of kidney stones 03/24/2017  . HTN (hypertension) 10/10/2017  . Hypertension   . Lipoma 03/24/2017  . Preventative health care 10/10/2017  . Tinnitus 03/24/2017     Social History   Socioeconomic History  . Marital status: Single    Spouse name: Not on file  . Number of children: Not on file  . Years of education: Not on file  . Highest education  level: Not on file  Social Needs  . Financial resource strain: Not on file  . Food insecurity - worry: Not on file  . Food insecurity - inability: Not on file  . Transportation needs - medical: Not on file  . Transportation needs - non-medical: Not on file  Occupational History  . Not on file  Tobacco Use  . Smoking status: Never Smoker  . Smokeless tobacco: Never Used  Substance and Sexual Activity  . Alcohol use: Yes    Alcohol/week: 1.2 oz    Types: 2 Cans of beer per week  . Drug use: No  . Sexual activity: Not on file  Other Topics Concern  . Not on file  Social History Narrative   Works as Marine scientist, no dietary restrictions.    Past Surgical History:  Procedure Laterality Date  . APPENDECTOMY      Family History  Problem Relation Age of Onset  . Diabetes Mother   . Glaucoma Father   . Hypertension Father     No Known Allergies  Current Outpatient Medications on File Prior to Visit  Medication Sig Dispense Refill  . allopurinol (ZYLOPRIM) 100 MG tablet Take 1 tablet (100 mg total) by mouth daily. 30 tablet 3  . Colchicine 0.6 MG CAPS 2 caps po once then 1 cap po q 2 hours til pain relief, diarrhea or 6 caps in 24 hours 6 capsule 1  . cyclobenzaprine (FLEXERIL)  10 MG tablet Take 10 mg by mouth.    . dorzolamide-timolol (COSOPT) 22.3-6.8 MG/ML ophthalmic solution Place 1 drop into both eyes 2 (two) times daily.      Marland Kitchen HYDROcodone-acetaminophen (NORCO) 5-325 MG tablet Take 1 tablet by mouth every 6 (six) hours as needed for moderate pain. 30 tablet 0  . indomethacin (INDOCIN) 50 MG capsule Take 1 capsule (50 mg total) by mouth 3 (three) times daily as needed. 20 capsule 2  . metoprolol succinate (TOPROL-XL) 25 MG 24 hr tablet Take 1 tablet (25 mg total) by mouth daily. 30 tablet 3  . NON FORMULARY [facility]  Onychomycosis Nail Lacquer -  Fluconazole 2%, Terbinafine 1% DMSO Apply to affected nail once daily Qty. 120 gm 3 refills    .  ibuprofen (ADVIL,MOTRIN) 800 MG tablet Take 800 mg by mouth.     No current facility-administered medications on file prior to visit.     BP (!) 152/90   Pulse 88   Temp 98 F (36.7 C) (Oral)   Resp 16   Ht 5\' 8"  (1.727 m)   Wt 232 lb 3.2 oz (105.3 kg)   SpO2 99%   BMI 35.31 kg/m       Objective:   Physical Exam  General Appearance- Not in acute distress.    Chest and Lung Exam Auscultation: Breath sounds:-Normal. Clear even and unlabored. Adventitious sounds:- No Adventitious sounds.  Cardiovascular Auscultation:Rythm - Regular, rate and rythm. Heart Sounds -Normal heart sounds.  Abdomen Inspection:-Inspection Normal.  Palpation/Perucssion: Palpation and Percussion of the abdomen reveal- Non Tender, No Rebound tenderness, No rigidity(Guarding) and No Palpable abdominal masses.  Liver:-Normal.  Spleen:- Normal.   Back Mid lumbar spine tenderness to palpation. And lt si area paine Pain on straight leg lift. No Pain on lateral movements and flexion/extension of the spine.  Lower ext neurologic  L5-S1 sensation intact bilaterally. Normal patellar reflexes bilaterally. No foot drop bilaterally.      Assessment & Plan:  For back pain with some sciatica type features, I am going to prescribe you tramadol.  You can also use Tylenol over-the-counter.  Tramadol is for moderate to severe pain.  I want to to not use any NSAIDs presently since they can increase your blood pressure.  We will get lumbar spine x-ray today.  You had no blood in urine on repeat testing today.  But I do recommend caution and repeat urine and 2 weeks as cause for the prior episode not yet determined.  It is possible that she had small kidney stone that was passed.  If any recurrent hematuria on repeat UA or any gross hematuria then would recommend referral to urologist for probable cystoscopy.  Also if you have any kidney stone type presentation as discussed let us know as in that event I would go  ahead and recommend CT of abdomen and pelvis.  I want you to check your blood pressure over the next week.  Get an over-the-counter blood pressure cuff and check daily when relaxed.  If your blood pressures are over 140/90 then would recommend adding another blood pressure medication.  Would not increase metoprolol as has concerned that he might get some side effects with higher dose.  Follow-up in 2 weeks or as needed.

## 2017-11-14 NOTE — Patient Instructions (Addendum)
For back pain with some sciatica type features, I am going to prescribe you tramadol.  You can also use Tylenol over-the-counter.  Tramadol is for moderate to severe pain.  I want to to not use any NSAIDs presently since they can increase your blood pressure.  We will get lumbar spine x-ray today.  You had no blood in urine on repeat testing today.  But I do recommend caution and repeat urine in2 weeks as cause for the prior episode not yet determined.  It is possible that she had small kidney stone that was passed.  If any recurrent hematuria on repeat UA or any gross hematuria then would recommend referral to urologist for probable cystoscopy.  Also if you have any kidney stone type presentation as discussed let us know as in that event I would go ahead and recommend CT of abdomen and pelvis.  I want you to check your blood pressure over the next week.  Get an over-the-counter blood pressure cuff and check daily when relaxed.  If your blood pressures are over 140/90 then would recommend adding another blood pressure medication.  Would not increase metoprolol as has concerned that he might get some side effects with higher dose.  Follow-up in 2 weeks or as needed.   Ejercicios para la espalda (Back Exercises) Si tiene dolor de espalda, haga estos ejercicios 2 o 3veces por da, o como se lo haya indicado el mdico. Cuando el dolor desaparezca, hgalos una vez por da, pero haga ms repeticiones de cada ejercicio. Si no le duele la espalda, haga estos ejercicios una vez por da o como se lo haya indicado el mdico. EJERCICIOS Rodilla al pecho Repita estos pasos 3 o 5veces seguidas con cada pierna: 1. [place] boca arriba sobre una cama dura o sobre el suelo con las piernas extendidas. 2. Lleve una rodilla al pecho. 3. [place]. Para lograrlo tmese la rodilla o el muslo. 4. [place] de la rodilla hasta sentir una elongacin suave en la parte baja de la espalda. 5. Mantenga  la elongacin durante 10 a 30segundos. 6. Suelte y extienda la pierna lentamente. Inclinacin de la pelvis Repita estos pasos 5 o 10veces seguidas: 1. [place] boca arriba sobre una cama dura o sobre el suelo con las piernas extendidas. 2. Flexione las rodillas de manera que apunten al techo. Los pies deben estar apoyados en el suelo. 3. Contraiga los msculos de la parte baja del vientre (abdomen) para empujar la zona lumbar contra el suelo. Este movimiento har que el cccix apunte hacia el techo, en lugar de apuntar hacia abajo en direccin a los pies o al suelo. 4. Mantenga esta posicin durante 5 a 10segundos mientras contrae suavemente los msculos y respira con normalidad. El perro y el gato Repita estos pasos hasta que la zona lumbar se curve con ms facilidad: 1. [place] manos y las rodillas sobre una superficie firme. Las manos deben estar alineadas con los hombros y las rodillas con las caderas. Puede colocarse almohadillas debajo de las rodillas. 2. Deje caer la cabeza y lleve el cccix hacia abajo de modo que apunte en direccin al suelo para que la zona lumbar se arquee como el lomo de un gato [place]. 3. Mantenga esta posicin durante 5segundos. 4. Lentamente, levante la cabeza y lleve el cccix hacia arriba de modo que apunte en direccin al techo para que la espalda se arquee (hunda) como el lomo de un perro contento. [place]  posicin durante 5segundos. Flexiones de brazos Repita estos pasos 5 o 10veces seguidas: 1. [place] boca abajo en el suelo. [place] manos cerca de la cabeza, separadas aproximadamente al ancho de los hombros. 3. Con la espalda relajada y las caderas apoyadas en el suelo, extienda lentamente los brazos para levantar la mitad superior del cuerpo y Community education officer los hombros. No use los msculos de la espalda. Para estar ms cmodo, puede cambiar la [employer]. 4. Mantenga esta posicin durante  5segundos. 5. Lentamente vuelva a la posicin horizontal. Puentes Repita estos pasos 10veces seguidas: 1. Acustese boca arriba sobre una superficie firme. 2. Flexione las rodillas de manera que apunten al techo. Los pies deben estar apoyados en el suelo. 3. Contraiga los glteos y despegue las nalgas del suelo hasta que la cintura est casi a la altura de las rodillas. Si no siente el trabajo muscular en las nalgas y la parte posterior de los muslos, aleje los pies 1 o 2pulgadas (2,5 o 5centmetros) de las nalgas. 4. Mantenga esta posicin durante 3 a 5segundos. 5. Lentamente, vuelva a apoyar las nalgas en el suelo y relaje los glteos. Si este ejercicio le resulta muy fcil, intente realizarlo con los brazos cruzados [place]. Abdominales Repita estos pasos 5 o 10veces seguidas: 1. [place] boca arriba sobre una cama dura o sobre el suelo con las piernas extendidas. 2. Flexione las rodillas de manera que apunten al techo. Los pies deben estar apoyados en el suelo. 3. Cruce los [employer]. 4. Baje levemente el mentn en direccin al pecho, pero no doble el cuello. 5. Contraiga los msculos del abdomen y con lentitud eleve el pecho lo suficiente como para despegar levemente los omplatos del suelo. 6. Lentamente baje el pecho y la cabeza hasta el suelo. Elevaciones de espalda Repita estos pasos 5 o 10veces seguidas: 1. [place] boca abajo con los brazos a los costados y apoye la frente en el suelo. 2. Contraiga los msculos de las piernas y los glteos. 3. Lentamente despegue el pecho del suelo mientras mantiene las caderas apoyadas en el suelo. Mantenga la nuca alineada con la curvatura de la espalda. Mire hacia el suelo mientras hace este ejercicio. 4. Mantenga esta posicin durante 3 a 5segundos. 5. Lentamente baje el pecho y el rostro hasta el suelo. SOLICITE AYUDA SI:  El dolor de espalda se vuelve mucho ms intenso cuando hace un ejercicio.  El dolor de  espalda no se [place] 2horas despus de [employer] ejercicios. Si tiene alguno de [employer], deje de [employer] ejercicios. No vuelva a hacer los ejercicios a menos que el mdico lo autorice. SOLICITE AYUDA DE INMEDIATO SI:  Siente un dolor sbito y muy intenso en la espalda. Si esto ocurre, deje de [employer]. No vuelva a hacer los ejercicios a menos que el mdico lo autorice. Esta informacin no tiene Marine scientist el consejo del mdico. Asegrese de hacerle al mdico cualquier pregunta que tenga. Document Released: 03/30/2011 Document Revised: 04/05/2016 Document Reviewed: 02/06/2015 Elsevier Interactive Patient Education  [employer].

## 2017-11-15 ENCOUNTER — Telehealth: Payer: Self-pay | Admitting: Medical

## 2017-11-15 ENCOUNTER — Ambulatory Visit: Payer: BLUE CROSS/BLUE SHIELD | Admitting: Medical

## 2017-11-15 NOTE — Telephone Encounter (Signed)
I have nurse visit on my desk top. But then I saw him as office visit. Will you take out your nurse visit note or send it for me to sign off. Right now it is just their. I can show it to you if you don't understand.

## 2017-11-18 ENCOUNTER — Telehealth: Payer: Self-pay | Admitting: Medical

## 2017-11-18 NOTE — Telephone Encounter (Signed)
Will you try to take our nurse note. I can't close this out since it has you as the Chief Strategy Officer. See if you can close this/remove. Can you call someone from epic team. Let me know what they say please.

## 2017-11-18 NOTE — Progress Notes (Unsigned)
Note sent to me regarding pt urine. Nurse ran the urine.

## 2017-11-28 ENCOUNTER — Ambulatory Visit: Payer: BLUE CROSS/BLUE SHIELD | Admitting: Medical

## 2017-11-28 ENCOUNTER — Encounter: Payer: Self-pay | Admitting: Medical

## 2017-11-28 VITALS — BP 150/91 | HR 89 | Temp 98.2°F | Resp 16 | Wt 228.0 lb

## 2017-11-28 DIAGNOSIS — M5440 Lumbago with sciatica, unspecified side: Secondary | ICD-10-CM

## 2017-11-28 DIAGNOSIS — Z87448 Personal history of other diseases of urinary system: Secondary | ICD-10-CM

## 2017-11-28 DIAGNOSIS — I10 Essential (primary) hypertension: Secondary | ICD-10-CM | POA: Diagnosis not present

## 2017-11-28 LAB — POC URINALSYSI DIPSTICK (AUTOMATED)
Bilirubin, UA: NEGATIVE
Glucose, UA: NEGATIVE
Ketones, UA: NEGATIVE
Leukocytes, UA: NEGATIVE
Nitrite, UA: NEGATIVE
PROTEIN UA: NEGATIVE
UROBILINOGEN UA: NEGATIVE U/dL — AB
pH, UA: 6 (ref 5.0–8.0)

## 2017-11-28 MED ORDER — LOSARTAN POTASSIUM 50 MG PO TABS: 50.0000 mg | ORAL_TABLET | Freq: Every day | ORAL | 3 refills | Status: DC

## 2017-11-28 NOTE — Patient Instructions (Addendum)
For your high blood pressure, I do recommend you adding low dose losartan to your bp regimen. Continue the metoprolol as well. It would be better for bp to be closer to 130/80. Will add low losartan to your regimen.  For persisting blood in urine and hx of recent gross hematuria will refer you to urologist.  For recurrent symptoms urinary tract of back let us know.  Follow up in 3-4 weeks or as needed at least my chart [place] your bp readings.

## 2017-11-28 NOTE — Progress Notes (Signed)
Subjective:    Patient ID: Joseph [name], male    DOB: 1971-07-31, 46 y.o.   MRN: [id number]  HPI   Pt in states overall he feels better with his back. He only reports faint minimal discomfort now after things such as driving long distance such as faint mild flare after driving to [place]. But no severe constant pain or radiating pain. No recurrent gross blood in urine.  Last time urine checked no blood.   Pt at home bp readings area 125-145/94 range. These are frequent checks. Pt is on toprol xl 1 tab a day.     Review of Systems  Constitutional: Negative for chills, fatigue and fever.  HENT: Negative for congestion and drooling.   Respiratory: Negative for cough, choking, shortness of breath and wheezing.   Cardiovascular: Negative for chest pain and palpitations.  Gastrointestinal: Negative for abdominal pain, constipation, nausea, rectal pain and vomiting.  Genitourinary: Negative for difficulty urinating, frequency, testicular pain and urgency.  Musculoskeletal: Negative for back pain, joint swelling and neck pain.       See HPI  Neurological: Negative for dizziness, speech difficulty, weakness and light-headedness.  Hematological: Negative for adenopathy. Does not bruise/bleed easily.   Past Medical History:  Diagnosis Date  . Back pain 03/24/2017  . 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 Glaucoma   . Gout   . History of degenerative disc disease 03/24/2017  . History of kidney stones 03/24/2017  . HTN (hypertension) 10/10/2017  . Hypertension   . Lipoma 03/24/2017  . Preventative health care 10/10/2017  . Tinnitus 03/24/2017     Social History   Socioeconomic History  . Marital status: Single    Spouse name: Not on file  . Number of children: Not on file  . Years of education: Not on file  . Highest education level: Not on file  Social Needs  . Financial resource strain: Not on file  . Food insecurity - worry: Not on file  . Food insecurity - inability: Not on  file  . Transportation needs - medical: Not on file  . Transportation needs - non-medical: Not on file  Occupational History  . Not on file  Tobacco Use  . Smoking status: Never Smoker  . Smokeless tobacco: Never Used  Substance and Sexual Activity  . Alcohol use: Yes    Alcohol/week: 1.2 oz    Types: 2 Cans of beer per week  . Drug use: No  . Sexual activity: Not on file  Other Topics Concern  . Not on file  Social History Narrative   Works as Marine scientist, no dietary restrictions.    Past Surgical History:  Procedure Laterality Date  . APPENDECTOMY      Family History  Problem Relation Age of Onset  . Diabetes Mother   . Glaucoma Father   . Hypertension Father     No Known Allergies  Current Outpatient Medications on File Prior to Visit  Medication Sig Dispense Refill  . allopurinol (ZYLOPRIM) 100 MG tablet Take 1 tablet (100 mg total) by mouth daily. 30 tablet 3  . Colchicine 0.6 MG CAPS 2 caps po once then 1 cap po q 2 hours til pain relief, diarrhea or 6 caps in 24 hours 6 capsule 1  . dorzolamide-timolol (COSOPT) 22.3-6.8 MG/ML ophthalmic solution Place 1 drop into both eyes 2 (two) times daily.      Marland Kitchen HYDROcodone-acetaminophen (NORCO) 5-325 MG tablet Take 1 tablet by mouth every 6 (six) hours  as needed for moderate pain. 30 tablet 0  . indomethacin (INDOCIN) 50 MG capsule Take 1 capsule (50 mg total) by mouth 3 (three) times daily as needed. 20 capsule 2  . metoprolol succinate (TOPROL-XL) 25 MG 24 hr tablet Take 1 tablet (25 mg total) by mouth daily. 30 tablet 3  . NON FORMULARY [facility]  Onychomycosis Nail Lacquer -  Fluconazole 2%, Terbinafine 1% DMSO Apply to affected nail once daily Qty. 120 gm 3 refills    . traMADol (ULTRAM) 50 MG tablet Take 1 tablet (50 mg total) every 6 (six) hours as needed by mouth. 16 tablet 0   No current facility-administered medications on file prior to visit.     BP (!) 150/91   Pulse 89   Temp  98.2 F (36.8 C) (Oral)   Resp 16   Wt 228 lb (103.4 kg)   SpO2 100%   BMI 34.67 kg/m      Objective:   Physical Exam  General Appearance- Not in acute distress.    Chest and Lung Exam Auscultation: Breath sounds:-Normal. Clear even and unlabored. Adventitious sounds:- No Adventitious sounds.  Cardiovascular Auscultation:Rythm - Regular, rate and rythm. Heart Sounds -Normal heart sounds.  Abdomen Inspection:-Inspection Normal.  Palpation/Perucssion: Palpation and Percussion of the abdomen reveal- Non Tender, No Rebound tenderness, No rigidity(Guarding) and No Palpable abdominal masses.  Liver:-Normal.  Spleen:- Normal.   Back  No mid lumbar spine tenderness to palpation. Pain on straight leg lift. Pain on lateral movements and flexion/extension of the spine.  Lower ext neurologic  L5-S1 sensation intact bilaterally. Normal patellar reflexes bilaterally. No foot drop bilaterally.      Assessment & Plan:  For your high blood pressure, I do recommend you adding low dose losartan to your bp regimen. Continue the metoprolol as well. It would be better for bp to be closer to 130/80. Will add low losartan to your regimen.  For persisting blood in urine and hx of recent gross hematuria will refer you to urologist.  For recurrent symptoms urinary tract of back let us know.  Follow up in 3-4 weeks or as needed at least my chart [place] your bp readings.  Sharise Lippy, Percell Miller, PA-C

## 2018-01-09 ENCOUNTER — Ambulatory Visit: Payer: BLUE CROSS/BLUE SHIELD | Admitting: Family Medicine

## 2018-01-09 VITALS — BP 120/72 | HR 81 | Temp 97.9°F | Resp 18 | Wt 231.4 lb

## 2018-01-09 DIAGNOSIS — I10 Essential (primary) hypertension: Secondary | ICD-10-CM

## 2018-01-09 DIAGNOSIS — Z87442 Personal history of urinary calculi: Secondary | ICD-10-CM

## 2018-01-09 DIAGNOSIS — R319 Hematuria, unspecified: Secondary | ICD-10-CM | POA: Diagnosis not present

## 2018-01-09 DIAGNOSIS — E6609 Other obesity due to excess calories: Secondary | ICD-10-CM

## 2018-01-09 DIAGNOSIS — E782 Mixed hyperlipidemia: Secondary | ICD-10-CM

## 2018-01-09 DIAGNOSIS — M1A0790 Idiopathic chronic gout, unspecified ankle and foot, without tophus (tophi): Secondary | ICD-10-CM | POA: Diagnosis not present

## 2018-01-09 LAB — POC URINALSYSI DIPSTICK (AUTOMATED)
Bilirubin, UA: NEGATIVE
GLUCOSE UA: NEGATIVE
Ketones, UA: NEGATIVE
Leukocytes, UA: NEGATIVE
Nitrite, UA: NEGATIVE
Protein, UA: NEGATIVE
RBC UA: NEGATIVE
UROBILINOGEN UA: 0.2 U/dL
pH, UA: 6 (ref 5.0–8.0)

## 2018-01-09 MED ORDER — METOPROLOL SUCCINATE ER 25 MG PO TB24: 25.0000 mg | ORAL_TABLET | Freq: Every day | ORAL | 5 refills | Status: DC

## 2018-01-09 MED ORDER — LOSARTAN POTASSIUM 50 MG PO TABS: 50.0000 mg | ORAL_TABLET | Freq: Every day | ORAL | 5 refills | Status: DC

## 2018-01-09 NOTE — Assessment & Plan Note (Signed)
Encouraged DASH diet, decrease po intake and increase exercise as tolerated. Needs 7-8 hours of sleep nightly. Avoid trans fats, eat small, frequent meals every 4-5 hours with lean proteins, complex carbs and healthy fats. Minimize simple carbs 

## 2018-01-09 NOTE — Assessment & Plan Note (Signed)
With recent episode of hematuria has an appt with urology next month. Back pain has resolved

## 2018-01-09 NOTE — Assessment & Plan Note (Signed)
Encouraged heart healthy diet, increase exercise, avoid trans fats, consider a krill oil cap daily 

## 2018-01-09 NOTE — Progress Notes (Signed)
Subjective:  I acted as a Education administrator for Dr. Charlett Blake. [place], [place]  Patient ID: Joseph [name], male    DOB: [birth year], 47 y.o.   MRN: [id number]  No chief complaint on file.   HPI  Patient is in today for a follow up visit and is feeling well today. He has been tracking his bp at home and brings in a log. No concerning numbers. No recent febrile illness or hospitalizations. Had an episode of hematuria with back pain once  But symptoms have resolved. He is seeing urology next month for follow up.. No further gout pain and did not start meds. Denies CP/palp/SOB/HA/congestion/fevers/GI or GU c/o. Taking meds as prescribed   Patient Care Team: Mosie Lukes, MD as PCP - General (Family Medicine) Hortencia Pilar, MD as Consulting Physician (Surgery)   Past Medical History:  Diagnosis Date  . Back pain 03/24/2017  . 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 Glaucoma   . Gout   . History of degenerative disc disease 03/24/2017  . History of kidney stones 03/24/2017  . HTN (hypertension) 10/10/2017  . Hypertension   . Lipoma 03/24/2017  . Preventative health care 10/10/2017  . Tinnitus 03/24/2017    Past Surgical History:  Procedure Laterality Date  . APPENDECTOMY      Family History  Problem Relation Age of Onset  . Diabetes Mother   . Glaucoma Father   . Hypertension Father     Social History   Socioeconomic History  . Marital status: Single    Spouse name: Not on file  . Number of children: Not on file  . Years of education: Not on file  . Highest education level: Not on file  Social Needs  . Financial resource strain: Not on file  . Food insecurity - worry: Not on file  . Food insecurity - inability: Not on file  . Transportation needs - medical: Not on file  . Transportation needs - non-medical: Not on file  Occupational History  . Not on file  Tobacco Use  . Smoking status: Never Smoker  . Smokeless tobacco: Never Used  Substance and Sexual Activity  .  Alcohol use: Yes    Alcohol/week: 1.2 oz    Types: 2 Cans of beer per week  . Drug use: No  . Sexual activity: Not on file  Other Topics Concern  . Not on file  Social History Narrative   Works as Marine scientist, no dietary restrictions.    Outpatient Medications Prior to Visit  Medication Sig Dispense Refill  . allopurinol (ZYLOPRIM) 100 MG tablet Take 1 tablet (100 mg total) by mouth daily. 30 tablet 3  . Colchicine 0.6 MG CAPS 2 caps po once then 1 cap po q 2 hours til pain relief, diarrhea or 6 caps in 24 hours 6 capsule 1  . dorzolamide-timolol (COSOPT) 22.3-6.8 MG/ML ophthalmic solution Place 1 drop into both eyes 2 (two) times daily.      Marland Kitchen HYDROcodone-acetaminophen (NORCO) 5-325 MG tablet Take 1 tablet by mouth every 6 (six) hours as needed for moderate pain. 30 tablet 0  . indomethacin (INDOCIN) 50 MG capsule Take 1 capsule (50 mg total) by mouth 3 (three) times daily as needed. 20 capsule 2  . losartan (COZAAR) 50 MG tablet Take 1 tablet (50 mg total) by mouth daily. 30 tablet 3  . metoprolol succinate (TOPROL-XL) 25 MG 24 hr tablet Take 1 tablet (25 mg total) by mouth daily. 30 tablet 3  .  NON FORMULARY [facility]  Onychomycosis Nail Lacquer -  Fluconazole 2%, Terbinafine 1% DMSO Apply to affected nail once daily Qty. 120 gm 3 refills    . traMADol (ULTRAM) 50 MG tablet Take 1 tablet (50 mg total) every 6 (six) hours as needed by mouth. 16 tablet 0   No facility-administered medications prior to visit.     No Known Allergies  Review of Systems  Constitutional: Negative for fever and malaise/fatigue.  HENT: Negative for congestion.   Eyes: Negative for blurred vision.  Respiratory: Negative for cough and shortness of breath.   Cardiovascular: Negative for chest pain, palpitations and leg swelling.  Gastrointestinal: Negative for vomiting.  Musculoskeletal: Negative for back pain.  Skin: Negative for rash.  Neurological: Negative for loss of  consciousness and headaches.       Objective:    Physical Exam  Constitutional: He is oriented to person, place, and time. He appears well-developed and well-nourished. No distress.  HENT:  Head: Normocephalic and atraumatic.  Eyes: Conjunctivae are normal.  Neck: Normal range of motion. No thyromegaly present.  Cardiovascular: Normal rate and regular rhythm.  Pulmonary/Chest: Effort normal and breath sounds normal. He has no wheezes.  Abdominal: Soft. Bowel sounds are normal. There is no tenderness.  Musculoskeletal: Normal range of motion. He exhibits no edema or deformity.  Neurological: He is alert and oriented to person, place, and time.  Skin: Skin is warm and dry. He is not diaphoretic.  Psychiatric: He has a normal mood and affect.    BP 120/72 (BP Location: Left Arm, Patient Position: Sitting, Cuff Size: Normal)   Pulse 81   Temp 97.9 F (36.6 C) (Oral)   Resp 18   Wt 231 lb 6.4 oz (105 kg)   SpO2 98%   BMI 35.18 kg/m  Wt Readings from Last 3 Encounters:  01/09/18 231 lb 6.4 oz (105 kg)  11/28/17 228 lb (103.4 kg)  11/14/17 232 lb 3.2 oz (105.3 kg)   BP Readings from Last 3 Encounters:  01/09/18 120/72  11/28/17 (!) 150/91  11/14/17 (!) 152/90     Immunization History  Administered Date(s) Administered  . Influenza,inj,Quad PF,6+ Mos 10/10/2017  . Td 10/10/2017    Health Maintenance  Topic Date Due  . HIV Screening  12/04/1984  . TETANUS/TDAP  10/11/2027  . INFLUENZA VACCINE  Completed    Lab Results  Component Value Date   WBC 7.7 10/10/2017   HGB 16.1 10/10/2017   HCT 47.9 10/10/2017   PLT 255.0 10/10/2017   GLUCOSE 98 10/10/2017   CHOL 171 10/10/2017   TRIG 114.0 10/10/2017   HDL 43.10 10/10/2017   LDLCALC 105 (H) 10/10/2017   ALT 27 10/10/2017   AST 19 10/10/2017   NA 136 10/10/2017   K 4.5 10/10/2017   CL 101 10/10/2017   CREATININE 1.00 10/10/2017   BUN 14 10/10/2017   CO2 29 10/10/2017   TSH 1.62 10/10/2017   HGBA1C 5.2  03/24/2017    Lab Results  Component Value Date   TSH 1.62 10/10/2017   Lab Results  Component Value Date   WBC 7.7 10/10/2017   HGB 16.1 10/10/2017   HCT 47.9 10/10/2017   MCV 89.2 10/10/2017   PLT 255.0 10/10/2017   Lab Results  Component Value Date   NA 136 10/10/2017   K 4.5 10/10/2017   CO2 29 10/10/2017   GLUCOSE 98 10/10/2017   BUN 14 10/10/2017   CREATININE 1.00 10/10/2017   BILITOT 0.6 10/10/2017  ALKPHOS 62 10/10/2017   AST 19 10/10/2017   ALT 27 10/10/2017   PROT 7.0 10/10/2017   ALBUMIN 4.4 10/10/2017   CALCIUM 9.6 10/10/2017   GFR 84.82 10/10/2017   Lab Results  Component Value Date   CHOL 171 10/10/2017   Lab Results  Component Value Date   HDL 43.10 10/10/2017   Lab Results  Component Value Date   LDLCALC 105 (H) 10/10/2017   Lab Results  Component Value Date   TRIG 114.0 10/10/2017   Lab Results  Component Value Date   CHOLHDL 4 10/10/2017   Lab Results  Component Value Date   HGBA1C 5.2 03/24/2017         Assessment & Plan:   Problem List Items Addressed This Visit    Gout    No further episodes encouraged adequate hydration      Obesity    Encouraged DASH diet, decrease po intake and increase exercise as tolerated. Needs 7-8 hours of sleep nightly. Avoid trans fats, eat small, frequent meals every 4-5 hours with lean proteins, complex carbs and healthy fats. Minimize simple carbs      History of kidney stones    With recent episode of hematuria has an appt with urology next month. Back pain has resolved      Hyperlipidemia, mixed    Encouraged heart healthy diet, increase exercise, avoid trans fats, consider a krill oil cap daily      HTN (hypertension)    Well controlled, no changes to meds. Encouraged heart healthy diet such as the DASH diet and exercise as tolerated.        Other Visit Diagnoses    Hematuria, unspecified type    -  Primary   Relevant Orders   POCT Urinalysis Dipstick (Automated) (Completed)        I am having Joseph [name] maintain his dorzolamide-timolol, metoprolol succinate, indomethacin, Colchicine, HYDROcodone-acetaminophen, allopurinol, NON FORMULARY, traMADol, and losartan.  No orders of the defined types were placed in this encounter.   CMA served as Education administrator during this visit. History, Physical and Plan performed by medical provider. Documentation and orders reviewed and attested to.  Penni Homans, MD

## 2018-01-09 NOTE — Assessment & Plan Note (Signed)
Well controlled, no changes to meds. Encouraged heart healthy diet such as the DASH diet and exercise as tolerated.  °

## 2018-01-09 NOTE — Patient Instructions (Addendum)
Alprazolam is the medicine that can help with the anxiety of flying DASH diet  Hypertension Hypertension is another name for high blood pressure. High blood pressure forces your heart to work harder to pump blood. This can cause problems over time. There are two numbers in a blood pressure reading. There is a top number (systolic) over a bottom number (diastolic). It is best to have a blood pressure below 120/80. Healthy choices can help lower your blood pressure. You may need medicine to help lower your blood pressure if:  Your blood pressure cannot be lowered with healthy choices.  Your blood pressure is higher than 130/80.  Follow these instructions at home: Eating and drinking  If directed, follow the DASH eating plan. This diet includes: ? Filling half of your plate at each meal with fruits and vegetables. ? Filling one quarter of your plate at each meal with whole grains. Whole grains include whole wheat pasta, brown rice, and whole grain bread. ? Eating or drinking low-fat dairy products, such as skim milk or low-fat yogurt. ? Filling one quarter of your plate at each meal with low-fat (lean) proteins. Low-fat proteins include fish, skinless chicken, eggs, beans, and tofu. ? Avoiding fatty meat, cured and processed meat, or chicken with skin. ? Avoiding premade or processed food.  Eat less than 1,500 mg of salt (sodium) a day.  Limit alcohol use to no more than 1 drink a day for nonpregnant women and 2 drinks a day for men. One drink equals 12 oz of beer, 5 oz of wine, or 1 oz of hard liquor. Lifestyle  Work with your doctor to stay at a healthy weight or to lose weight. Ask your doctor what the best weight is for you.  Get at least 30 minutes of exercise that causes your heart to beat faster (aerobic exercise) most days of the week. This may include walking, swimming, or biking.  Get at least 30 minutes of exercise that strengthens your muscles (resistance exercise) at least 3  days a week. This may include lifting weights or pilates.  Do not use any products that contain nicotine or tobacco. This includes cigarettes and e-cigarettes. If you need help quitting, ask your doctor.  Check your blood pressure at home as told by your doctor.  Keep all follow-up visits as told by your doctor. This is important. Medicines  Take over-the-counter and prescription medicines only as told by your doctor. Follow directions carefully.  Do not skip doses of blood pressure medicine. The medicine does not work as well if you skip doses. Skipping doses also puts you at risk for problems.  Ask your doctor about side effects or reactions to medicines that you should watch for. Contact a doctor if:  You think you are having a reaction to the medicine you are taking.  You have headaches that keep coming back (recurring).  You feel dizzy.  You have swelling in your ankles.  You have trouble with your vision. Get help right away if:  You get a very bad headache.  You start to feel confused.  You feel weak or numb.  You feel faint.  You get very bad pain in your: ? Chest. ? Belly (abdomen).  You throw up (vomit) more than once.  You have trouble breathing. Summary  Hypertension is another name for high blood pressure.  Making healthy choices can help lower blood pressure. If your blood pressure cannot be controlled with healthy choices, you may need to take medicine.  This information is not intended to replace advice given to you by your health care provider. Make sure you discuss any questions you have with your health care provider. Document Released: 05/31/2008 Document Revised: 11/10/2016 Document Reviewed: 11/10/2016 Elsevier Interactive Patient Education  [employer].

## 2018-01-09 NOTE — Assessment & Plan Note (Signed)
No further episodes encouraged adequate hydration

## 2018-02-03 DIAGNOSIS — R310 Gross hematuria: Secondary | ICD-10-CM | POA: Diagnosis not present

## 2018-02-13 DIAGNOSIS — N200 Calculus of kidney: Secondary | ICD-10-CM | POA: Diagnosis not present

## 2018-02-13 DIAGNOSIS — R310 Gross hematuria: Secondary | ICD-10-CM | POA: Diagnosis not present

## 2018-03-03 ENCOUNTER — Ambulatory Visit: Payer: BLUE CROSS/BLUE SHIELD | Admitting: Medical

## 2018-03-03 ENCOUNTER — Encounter: Payer: Self-pay | Admitting: Medical

## 2018-03-03 ENCOUNTER — Telehealth: Payer: Self-pay | Admitting: Family Medicine

## 2018-03-03 VITALS — BP 143/92 | HR 113 | Temp 98.6°F | Resp 16 | Ht 68.0 in | Wt 230.2 lb

## 2018-03-03 DIAGNOSIS — J029 Acute pharyngitis, unspecified: Secondary | ICD-10-CM

## 2018-03-03 MED ORDER — AMOXICILLIN-POT CLAVULANATE 400-57 MG/5ML PO SUSR: ORAL | 0 refills | Status: DC

## 2018-03-03 MED ORDER — CEFTRIAXONE SODIUM 1 G IJ SOLR
1.0000 g | Freq: Once | INTRAMUSCULAR | Status: AC
  Administered 2018-03-03: 1 g via INTRAMUSCULAR

## 2018-03-03 MED ORDER — CICLOPIROX 8 % EX SOLN: Freq: Every day | CUTANEOUS | 0 refills | Status: DC

## 2018-03-03 MED ORDER — KETOROLAC TROMETHAMINE 60 MG/2ML IM SOLN
60.0000 mg | Freq: Once | INTRAMUSCULAR | Status: AC
  Administered 2018-03-03: 60 mg via INTRAMUSCULAR

## 2018-03-03 NOTE — Patient Instructions (Signed)
Your rapid strep test came back negative.  However CMA informed me that the swab was not good quality due to your gag reflex.  Based on your clinical presentation and high level of pain and swelling, I do want to treat you more aggressively than usual.  We gave you 1 g Rocephin today and prescribing Augmentin oral antibiotic as well.  To help with your severe pain we also gave you Toradol 60 mg IM.  If over the weekend you feel that your throat pain is getting worse rather than better or have difficulty swallowing then recommend ED evaluation as there can be occasional severe infections resistant to antibiotics.  Follow-up in 7 days or as needed.  Also would like an update on Monday hopefully confirming that you are improving.

## 2018-03-03 NOTE — Telephone Encounter (Signed)
Patient's wife called for clarification on administration of Augmentin. She asked "does it need to be every 12 hours." I advised it can be taken in the morning and evening, about 10-12 hours apart, but not closer than 8 hours in between the doses. She verbalized understanding and asked about medicine for the throat pain. I advised Tylenol every 4 hours. He asked can he take Ibuprofen, I advised yes as long as he can tolerate, take 2 tabs every 6 hours or 3 tabs every 8 hours, they both verbalized understanding.

## 2018-03-03 NOTE — Progress Notes (Signed)
Subjective:    Patient ID: Joseph [name], male    DOB: [birth year], 47 y.o.   MRN: [id number]  HPI  With pt with st so okay. He states he pain is moderate to severe. He states hurts to swallow. Wife makes comment that he skipped breakfast this am due to the pain.  Very minimal cough today.  No fever, no chills or sweats.  Patient states that the pain is so severe swelling that he prefers to spit out his saliva rather than swallowing.  However did discuss with patient and clarified that he is able to swallow.  Review of Systems  Constitutional: Negative for chills and fatigue.  HENT: Positive for sore throat. Negative for congestion, sinus pressure and sinus pain.   Respiratory: Positive for cough. Negative for chest tightness, shortness of breath and wheezing.   Cardiovascular: Negative for chest pain and palpitations.  Gastrointestinal: Negative for abdominal pain, nausea and vomiting.  Genitourinary: Negative for enuresis, flank pain and hematuria.  Musculoskeletal: Negative for back pain, gait problem and neck pain.  Skin: Negative for rash.  Neurological: Positive for weakness. Negative for dizziness, tremors and headaches.  Hematological: Negative for adenopathy. Does not bruise/bleed easily.  Psychiatric/Behavioral: Negative for behavioral problems, confusion, dysphoric mood and self-injury. The patient is not nervous/anxious.    Past Medical History:  Diagnosis Date  . Back pain 03/24/2017  . 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 Glaucoma   . Gout   . History of degenerative disc disease 03/24/2017  . History of kidney stones 03/24/2017  . HTN (hypertension) 10/10/2017  . Hypertension   . Lipoma 03/24/2017  . Preventative health care 10/10/2017  . Tinnitus 03/24/2017     Social History   Socioeconomic History  . Marital status: Single    Spouse name: Not on file  . Number of children: Not on file  . Years of education: Not on file  . Highest education level: Not on  file  Social Needs  . Financial resource strain: Not on file  . Food insecurity - worry: Not on file  . Food insecurity - inability: Not on file  . Transportation needs - medical: Not on file  . Transportation needs - non-medical: Not on file  Occupational History  . Not on file  Tobacco Use  . Smoking status: Never Smoker  . Smokeless tobacco: Never Used  Substance and Sexual Activity  . Alcohol use: Yes    Alcohol/week: 1.2 oz    Types: 2 Cans of beer per week  . Drug use: No  . Sexual activity: Not on file  Other Topics Concern  . Not on file  Social History Narrative   Works as Marine scientist, no dietary restrictions.    Past Surgical History:  Procedure Laterality Date  . APPENDECTOMY      Family History  Problem Relation Age of Onset  . Diabetes Mother   . Glaucoma Father   . Hypertension Father     No Known Allergies  Current Outpatient Medications on File Prior to Visit  Medication Sig Dispense Refill  . allopurinol (ZYLOPRIM) 100 MG tablet Take 1 tablet (100 mg total) by mouth daily. 30 tablet 3  . Colchicine 0.6 MG CAPS 2 caps po once then 1 cap po q 2 hours til pain relief, diarrhea or 6 caps in 24 hours 6 capsule 1  . dorzolamide-timolol (COSOPT) 22.3-6.8 MG/ML ophthalmic solution Place 1 drop into both eyes 2 (two) times daily.      Marland Kitchen  HYDROcodone-acetaminophen (NORCO) 5-325 MG tablet Take 1 tablet by mouth every 6 (six) hours as needed for moderate pain. 30 tablet 0  . indomethacin (INDOCIN) 50 MG capsule Take 1 capsule (50 mg total) by mouth 3 (three) times daily as needed. 20 capsule 2  . losartan (COZAAR) 50 MG tablet Take 1 tablet (50 mg total) by mouth daily. 30 tablet 5  . metoprolol succinate (TOPROL-XL) 25 MG 24 hr tablet Take 1 tablet (25 mg total) by mouth daily. 30 tablet 5  . NON FORMULARY [facility]  Onychomycosis Nail Lacquer -  Fluconazole 2%, Terbinafine 1% DMSO Apply to affected nail once daily Qty. 120 gm 3  refills    . traMADol (ULTRAM) 50 MG tablet Take 1 tablet (50 mg total) every 6 (six) hours as needed by mouth. 16 tablet 0   No current facility-administered medications on file prior to visit.     BP (!) 143/92   Pulse (!) 113   Temp 98.6 F (37 C) (Oral)   Resp 16   Ht 5\' 8"  (1.727 m)   Wt 230 lb 3.2 oz (104.4 kg)   SpO2 100%   BMI 35.00 kg/m       Objective:   Physical Exam  General  Mental Status - Alert. General Appearance - Well groomed. Not in acute distress.  Skin Rashes- No Rashes.  HEENT Head- Normal. Ear Auditory Canal - Left- Normal. Right - Normal.Tympanic Membrane- Left- Normal. Right- Normal. Eye Sclera/Conjunctiva- Left- Normal. Right- Normal. Nose & Sinuses Nasal Mucosa- Left-  Boggy and Congested. Right-  Boggy and  Congested.Bilateral maxillary and frontal sinus pressure. Mouth & Throat Lips: Upper Lip- Normal: no dryness, cracking, pallor, cyanosis, or vesicular eruption. Lower Lip-Normal: no dryness, cracking, pallor, cyanosis or vesicular eruption. Buccal Mucosa- Bilateral- No Aphthous ulcers. Oropharynx- No Discharge or Erythema. Tonsils: Characteristics- Bilateral-very bright erythema  Size/Enlargement- Bilateral- 1+ enlargement but size was difficult to ascertain as he had severe gag reflex.Marland Kitchen Discharge- bilateral-None. Did see obvious space between tonsils. No appearance of airway comprimise  Neck Neck- Supple. No Masses.  No obvious submandibular nodes palpable.  He has good range of motion of his neck.   Chest and Lung Exam Auscultation: Breath Sounds:-Clear even and unlabored.  Cardiovascular Auscultation:Rythm- Regular, rate and rhythm. Murmurs & Other Heart Sounds:Ausculatation of the heart reveal- No Murmurs.  Lymphatic Head & Neck General Head & Neck Lymphatics: Bilateral: Description- No Localized lymphadenopathy.       Assessment & Plan:  Your rapid strep test came back negative.  However CMA informed me that the swab  was not good quality due to your gag reflex.  Based on your clinical presentation and high level of pain and swelling, I do want to treat you more aggressively than usual.  We gave you 1 g Rocephin today and prescribing Augmentin oral antibiotic as well.  To help with your severe pain we also gave you Toradol 60 mg IM.  If over the weekend you feel that your throat pain is getting worse rather than better or have difficulty swallowing then recommend ED evaluation as there can be occasional severe infections resistant to antibiotics.  Follow-up in 7 days or as needed.  Also would like an update on Monday hopefully confirming that you are improving.  Mackie Pai, PA-C

## 2018-03-07 DIAGNOSIS — H401131 Primary open-angle glaucoma, bilateral, mild stage: Secondary | ICD-10-CM | POA: Diagnosis not present

## 2018-03-07 DIAGNOSIS — H04123 Dry eye syndrome of bilateral lacrimal glands: Secondary | ICD-10-CM | POA: Diagnosis not present

## 2018-03-17 DIAGNOSIS — J029 Acute pharyngitis, unspecified: Secondary | ICD-10-CM | POA: Diagnosis not present

## 2018-06-05 ENCOUNTER — Ambulatory Visit: Payer: BLUE CROSS/BLUE SHIELD | Admitting: Family Medicine

## 2018-06-05 ENCOUNTER — Encounter: Payer: Self-pay | Admitting: Family Medicine

## 2018-06-05 DIAGNOSIS — F411 Generalized anxiety disorder: Secondary | ICD-10-CM | POA: Diagnosis not present

## 2018-06-05 DIAGNOSIS — M1A0790 Idiopathic chronic gout, unspecified ankle and foot, without tophus (tophi): Secondary | ICD-10-CM

## 2018-06-05 DIAGNOSIS — I10 Essential (primary) hypertension: Secondary | ICD-10-CM

## 2018-06-05 DIAGNOSIS — E782 Mixed hyperlipidemia: Secondary | ICD-10-CM

## 2018-06-05 MED ORDER — ALPRAZOLAM 0.25 MG PO TABS: 0.2500 mg | ORAL_TABLET | Freq: Two times a day (BID) | ORAL | 0 refills | Status: DC | PRN

## 2018-06-05 MED ORDER — METHYLPREDNISOLONE 4 MG PO TABS: ORAL_TABLET | ORAL | 0 refills | Status: DC

## 2018-06-05 NOTE — Assessment & Plan Note (Addendum)
Check uric acid shows improvement no recent flares

## 2018-06-05 NOTE — Patient Instructions (Addendum)
Zyrtec/Cetirizine 10 mg tabs, 1 tab twice daily for allergies Zantac/Ranidine 150 mg tabs, 1 tab twice daily for allergies, heartburn Heartburn Heartburn is a type of pain or discomfort that can happen in the throat or chest. It is often described as a burning pain. It may also cause a bad taste in the mouth. Heartburn may feel worse when you lie down or bend over, and it is often worse at night. Heartburn may be caused by stomach contents that move back up into the esophagus (reflux). Follow these instructions at home: Take these actions to decrease your discomfort and to help avoid complications. Diet  Follow a diet as recommended by your health care provider. This may involve avoiding foods and drinks such as: ? Coffee and tea (with or without caffeine). ? Drinks that contain alcohol. ? Energy drinks and sports drinks. ? Carbonated drinks or sodas. ? Chocolate and cocoa. ? Peppermint and mint flavorings. ? Garlic and onions. ? Horseradish. ? Spicy and acidic foods, including peppers, chili powder, curry powder, vinegar, hot sauces, and barbecue sauce. ? Citrus fruit juices and citrus fruits, such as oranges, lemons, and limes. ? Tomato-based foods, such as red sauce, chili, salsa, and pizza with red sauce. ? Fried and fatty foods, such as donuts, french fries, potato chips, and high-fat dressings. ? High-fat meats, such as hot dogs and fatty cuts of red and white meats, such as rib eye steak, sausage, ham, and bacon. ? High-fat dairy items, such as whole milk, butter, and cream cheese.  Eat small, frequent meals instead of large meals.  Avoid drinking large amounts of liquid with your meals.  Avoid eating meals during the 2-3 hours before bedtime.  Avoid lying down right after you eat.  Do not exercise right after you eat. General instructions  Pay attention to any changes in your symptoms.  Take over-the-counter and prescription medicines only as told by your health care  provider. Do not take aspirin, ibuprofen, or other NSAIDs unless your health care provider told you to do so.  Do not use any tobacco products, including cigarettes, chewing tobacco, and e-cigarettes. If you need help quitting, ask your health care provider.  Wear loose-fitting clothing. Do not wear anything tight around your waist that causes pressure on your abdomen.  Raise (elevate) the head of your bed about 6 inches (15 cm).  Try to reduce your stress, such as with yoga or meditation. If you need help reducing stress, ask your health care provider.  If you are overweight, reduce your weight to an amount that is healthy for you. Ask your health care provider for guidance about a safe weight loss goal.  Keep all follow-up visits as told by your health care provider. This is important. Contact a health care provider if:  You have new symptoms.  You have unexplained weight loss.  You have difficulty swallowing, or it hurts to swallow.  You have wheezing or a persistent cough.  Your symptoms do not improve with treatment.  You have frequent heartburn for more than two weeks. Get help right away if:  You have pain in your arms, neck, jaw, teeth, or back.  You feel sweaty, dizzy, or light-headed.  You have chest pain or shortness of breath.  You vomit and your vomit looks like blood or coffee grounds.  Your stool is bloody or black. This information is not intended to replace advice given to you by your health care provider. Make sure you discuss any questions you have  with your health care provider. Document Released: 05/01/2009 Document Revised: 05/20/2016 Document Reviewed: 04/09/2015 Elsevier Interactive Patient Education  [employer].

## 2018-06-05 NOTE — Progress Notes (Signed)
Subjective:  I acted as a Education administrator for Dr. Charlett Blake. [place], [place]  Patient ID: Joseph [name], male    DOB: 12-30-1970, 47 y.o.   MRN: [id number]  No chief complaint on file.   HPI  Patient is in today for 5 month follow up and he is feeling well. No recent febrile illness or hospitalizations. Is tolerating Allopurinol and has not had a gout flare. Is hydrating well. He is flying to [place] soon and is apprehensive about flying is requesting a medicine to help his nerves. Denies CP/palp/SOB/HA/congestion/fevers/GI or GU c/o. Taking meds as prescribed  Patient Care Team: Mosie Lukes, MD as PCP - General (Family Medicine) Hortencia Pilar, MD as Consulting Physician (Surgery)   Past Medical History:  Diagnosis Date  . Back pain 03/24/2017  . 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 Glaucoma   . Gout   . History of degenerative disc disease 03/24/2017  . History of kidney stones 03/24/2017  . HTN (hypertension) 10/10/2017  . Hypertension   . Lipoma 03/24/2017  . Preventative health care 10/10/2017  . Tinnitus 03/24/2017    Past Surgical History:  Procedure Laterality Date  . APPENDECTOMY      Family History  Problem Relation Age of Onset  . Diabetes Mother   . Glaucoma Father   . Hypertension Father     Social History   Socioeconomic History  . Marital status: Single    Spouse name: Not on file  . Number of children: Not on file  . Years of education: Not on file  . Highest education level: Not on file  Occupational History  . Not on file  Social Needs  . Financial resource strain: Not on file  . Food insecurity:    Worry: Not on file    Inability: Not on file  . Transportation needs:    Medical: Not on file    Non-medical: Not on file  Tobacco Use  . Smoking status: Never Smoker  . Smokeless tobacco: Never Used  Substance and Sexual Activity  . Alcohol use: Yes    Alcohol/week: 1.2 oz    Types: 2 Cans of beer per week  . Drug use: No  . Sexual  activity: Not on file  Lifestyle  . Physical activity:    Days per week: Not on file    Minutes per session: Not on file  . Stress: Not on file  Relationships  . Social connections:    Talks on phone: Not on file    Gets together: Not on file    Attends religious service: Not on file    Active member of club or organization: Not on file    Attends meetings of clubs or organizations: Not on file    Relationship status: Not on file  . Intimate partner violence:    Fear of current or ex partner: Not on file    Emotionally abused: Not on file    Physically abused: Not on file    Forced sexual activity: Not on file  Other Topics Concern  . Not on file  Social History Narrative   Works as Marine scientist, no dietary restrictions.    Outpatient Medications Prior to Visit  Medication Sig Dispense Refill  . allopurinol (ZYLOPRIM) 100 MG tablet Take 1 tablet (100 mg total) by mouth daily. 30 tablet 3  . dorzolamide-timolol (COSOPT) 22.3-6.8 MG/ML ophthalmic solution Place 1 drop into both eyes 2 (two) times daily.      Marland Kitchen  losartan (COZAAR) 50 MG tablet Take 1 tablet (50 mg total) by mouth daily. 30 tablet 5  . metoprolol succinate (TOPROL-XL) 25 MG 24 hr tablet Take 1 tablet (25 mg total) by mouth daily. 30 tablet 5  . NON FORMULARY [facility]  Onychomycosis Nail Lacquer -  Fluconazole 2%, Terbinafine 1% DMSO Apply to affected nail once daily Qty. 120 gm 3 refills    . HYDROcodone-acetaminophen (NORCO) 5-325 MG tablet Take 1 tablet by mouth every 6 (six) hours as needed for moderate pain. 30 tablet 0  . amoxicillin-clavulanate (AUGMENTIN) 400-57 MG/5ML suspension 10 ml po bid x 10 days 200 mL 0  . ciclopirox (PENLAC) 8 % solution Apply topically at bedtime. Apply over nail and surrounding skin. Apply daily over previous coat. After seven (7) days, may remove with alcohol and continue cycle. 6.6 mL 0  . Colchicine 0.6 MG CAPS 2 caps po once then 1 cap po q 2 hours  til pain relief, diarrhea or 6 caps in 24 hours 6 capsule 1  . indomethacin (INDOCIN) 50 MG capsule Take 1 capsule (50 mg total) by mouth 3 (three) times daily as needed. 20 capsule 2  . traMADol (ULTRAM) 50 MG tablet Take 1 tablet (50 mg total) every 6 (six) hours as needed by mouth. 16 tablet 0   No facility-administered medications prior to visit.     No Known Allergies  Review of Systems  Constitutional: Negative for fever and malaise/fatigue.  HENT: Negative for congestion.   Eyes: Negative for blurred vision.  Respiratory: Negative for shortness of breath.   Cardiovascular: Negative for chest pain, palpitations and leg swelling.  Gastrointestinal: Negative for abdominal pain, blood in stool and nausea.  Genitourinary: Negative for dysuria and frequency.  Musculoskeletal: Negative for falls.  Skin: Negative for rash.  Neurological: Negative for dizziness, loss of consciousness and headaches.  Endo/Heme/Allergies: Negative for environmental allergies.  Psychiatric/Behavioral: Negative for depression. The patient is not nervous/anxious.        Objective:    Physical Exam  Constitutional: He is oriented to person, place, and time. No distress.  HENT:  Head: Normocephalic and atraumatic.  Eyes: Conjunctivae are normal.  Neck: Neck supple. No thyromegaly present.  Cardiovascular: Normal rate, regular rhythm and normal heart sounds.  No murmur heard. Pulmonary/Chest: Effort normal and breath sounds normal. No respiratory distress.  Abdominal: He exhibits no distension and no mass. There is no tenderness.  Musculoskeletal: He exhibits no edema.  Neurological: He is alert and oriented to person, place, and time.  Skin: Skin is warm.  Psychiatric: Judgment normal.    BP 127/75 (BP Location: Left Arm, Patient Position: Sitting, Cuff Size: Normal)   Pulse 67   Temp 98.2 F (36.8 C) (Oral)   Resp 18   Wt 234 lb (106.1 kg)   SpO2 99%   BMI 35.58 kg/m  Wt Readings from Last  3 Encounters:  06/05/18 234 lb (106.1 kg)  03/03/18 230 lb 3.2 oz (104.4 kg)  01/09/18 231 lb 6.4 oz (105 kg)   BP Readings from Last 3 Encounters:  06/05/18 127/75  03/03/18 (!) 143/92  01/09/18 120/72     Immunization History  Administered Date(s) Administered  . Influenza,inj,Quad PF,6+ Mos 10/10/2017  . Td 10/10/2017    Health Maintenance  Topic Date Due  . HIV Screening  12/04/1984  . INFLUENZA VACCINE  07/27/2018  . TETANUS/TDAP  10/11/2027    Lab Results  Component Value Date   WBC 10.4 06/05/2018  HGB 15.4 06/05/2018   HCT 45.0 06/05/2018   PLT 292.0 06/05/2018   GLUCOSE 86 06/05/2018   CHOL 202 (H) 06/05/2018   TRIG 123.0 06/05/2018   HDL 47.60 06/05/2018   LDLCALC 130 (H) 06/05/2018   ALT 26 06/05/2018   AST 20 06/05/2018   NA 138 06/05/2018   K 4.3 06/05/2018   CL 101 06/05/2018   CREATININE 1.03 06/05/2018   BUN 11 06/05/2018   CO2 29 06/05/2018   TSH 2.19 06/05/2018   HGBA1C 5.2 03/24/2017    Lab Results  Component Value Date   TSH 2.19 06/05/2018   Lab Results  Component Value Date   WBC 10.4 06/05/2018   HGB 15.4 06/05/2018   HCT 45.0 06/05/2018   MCV 88.8 06/05/2018   PLT 292.0 06/05/2018   Lab Results  Component Value Date   NA 138 06/05/2018   K 4.3 06/05/2018   CO2 29 06/05/2018   GLUCOSE 86 06/05/2018   BUN 11 06/05/2018   CREATININE 1.03 06/05/2018   BILITOT 0.4 06/05/2018   ALKPHOS 61 06/05/2018   AST 20 06/05/2018   ALT 26 06/05/2018   PROT 7.0 06/05/2018   ALBUMIN 4.6 06/05/2018   CALCIUM 9.8 06/05/2018   GFR 81.75 06/05/2018   Lab Results  Component Value Date   CHOL 202 (H) 06/05/2018   Lab Results  Component Value Date   HDL 47.60 06/05/2018   Lab Results  Component Value Date   LDLCALC 130 (H) 06/05/2018   Lab Results  Component Value Date   TRIG 123.0 06/05/2018   Lab Results  Component Value Date   CHOLHDL 4 06/05/2018   Lab Results  Component Value Date   HGBA1C 5.2 03/24/2017          Assessment & Plan:   Problem List Items Addressed This Visit    Gout    Check uric acid shows improvement no recent flares       Relevant Orders   Uric acid (Completed)   Hyperlipidemia, mixed    Encouraged heart healthy diet, increase exercise, avoid trans fats, consider a krill oil cap daily      Relevant Orders   Lipid panel (Completed)   HTN (hypertension)    Well controlled, no changes to meds. Encouraged heart healthy diet such as the DASH diet and exercise as tolerated.       Relevant Orders   CBC (Completed)   Comprehensive metabolic panel (Completed)   TSH (Completed)   Anxiety reaction    Patient taking a long plane flight soon. Is apprehensive about flying. Is allowed a small prescription for Alprazolam to use prn      Relevant Medications   ALPRAZolam (XANAX) 0.25 MG tablet      I have discontinued Joseph [name]'s indomethacin, Colchicine, HYDROcodone-acetaminophen, traMADol, ciclopirox, and amoxicillin-clavulanate. I am also having him start on ALPRAZolam and methylPREDNISolone. Additionally, I am having him maintain his dorzolamide-timolol, allopurinol, NON FORMULARY, losartan, and metoprolol succinate.  Meds ordered this encounter  Medications  . ALPRAZolam (XANAX) 0.25 MG tablet    Sig: Take 1 tablet (0.25 mg total) by mouth 2 (two) times daily as needed for anxiety.    Dispense:  10 tablet    Refill:  0  . methylPREDNISolone (MEDROL) 4 MG tablet    Sig: 5 tab po qd X 1d then 4 tab po qd X 1d then 3 tab po qd X 1d then 2 tab po qd then 1 tab po qd  Dispense:  15 tablet    Refill:  0    CMA served as scribe during this visit. History, Physical and Plan performed by medical provider. Documentation and orders reviewed and attested to.  Penni Homans, MD

## 2018-06-05 NOTE — Assessment & Plan Note (Signed)
Encouraged heart healthy diet, increase exercise, avoid trans fats, consider a krill oil cap daily 

## 2018-06-05 NOTE — Assessment & Plan Note (Signed)
Well controlled, no changes to meds. Encouraged heart healthy diet such as the DASH diet and exercise as tolerated.  °

## 2018-06-06 LAB — CBC
HEMATOCRIT: 45 % (ref 39.0–52.0)
HEMOGLOBIN: 15.4 g/dL (ref 13.0–17.0)
MCHC: 34.2 g/dL (ref 30.0–36.0)
MCV: 88.8 fl (ref 78.0–100.0)
PLATELETS: 292 10*3/uL (ref 150.0–400.0)
RBC: 5.07 Mil/uL (ref 4.22–5.81)
RDW: 12.7 % (ref 11.5–15.5)
WBC: 10.4 10*3/uL (ref 4.0–10.5)

## 2018-06-06 LAB — COMPREHENSIVE METABOLIC PANEL
ALBUMIN: 4.6 g/dL (ref 3.5–5.2)
ALK PHOS: 61 U/L (ref 39–117)
ALT: 26 U/L (ref 0–53)
AST: 20 U/L (ref 0–37)
BUN: 11 mg/dL (ref 6–23)
CO2: 29 mEq/L (ref 19–32)
Calcium: 9.8 mg/dL (ref 8.4–10.5)
Chloride: 101 mEq/L (ref 96–112)
Creatinine, Ser: 1.03 mg/dL (ref 0.40–1.50)
GFR: 81.75 mL/min (ref >60.00)
Glucose, Bld: 86 mg/dL (ref 70–99)
POTASSIUM: 4.3 meq/L (ref 3.5–5.1)
Sodium: 138 mEq/L (ref 135–145)
TOTAL PROTEIN: 7 g/dL (ref 6.0–8.3)
Total Bilirubin: 0.4 mg/dL (ref 0.2–1.2)

## 2018-06-06 LAB — LIPID PANEL
CHOLESTEROL: 202 mg/dL — AB (ref 0–200)
HDL: 47.6 mg/dL (ref >39.00)
LDL Cholesterol: 130 mg/dL — ABNORMAL HIGH (ref 0–99)
NonHDL: 154.55
Total CHOL/HDL Ratio: 4
Triglycerides: 123 mg/dL (ref 0.0–149.0)
VLDL: 24.6 mg/dL (ref 0.0–40.0)

## 2018-06-06 LAB — URIC ACID: Uric Acid, Serum: 7.2 mg/dL (ref 4.0–7.8)

## 2018-06-06 LAB — TSH: TSH: 2.19 u[IU]/mL (ref 0.35–4.50)

## 2018-06-07 DIAGNOSIS — F411 Generalized anxiety disorder: Secondary | ICD-10-CM

## 2018-06-07 HISTORY — DX: Generalized anxiety disorder: F41.1

## 2018-06-07 NOTE — Assessment & Plan Note (Signed)
Patient taking a long plane flight soon. Is apprehensive about flying. Is allowed a small prescription for Alprazolam to use prn

## 2018-06-12 ENCOUNTER — Ambulatory Visit: Payer: BLUE CROSS/BLUE SHIELD | Admitting: Family Medicine

## 2018-07-24 ENCOUNTER — Other Ambulatory Visit: Payer: Self-pay | Admitting: Family Medicine

## 2018-10-24 ENCOUNTER — Other Ambulatory Visit (INDEPENDENT_AMBULATORY_CARE_PROVIDER_SITE_OTHER): Payer: BLUE CROSS/BLUE SHIELD

## 2018-10-24 ENCOUNTER — Encounter: Payer: Self-pay | Admitting: Family Medicine

## 2018-10-24 ENCOUNTER — Telehealth: Payer: Self-pay | Admitting: Family Medicine

## 2018-10-24 ENCOUNTER — Ambulatory Visit (INDEPENDENT_AMBULATORY_CARE_PROVIDER_SITE_OTHER): Payer: BLUE CROSS/BLUE SHIELD | Admitting: Family Medicine

## 2018-10-24 VITALS — BP 118/82 | HR 72 | Temp 97.9°F | Resp 18 | Ht 68.0 in | Wt 241.0 lb

## 2018-10-24 DIAGNOSIS — E782 Mixed hyperlipidemia: Secondary | ICD-10-CM

## 2018-10-24 DIAGNOSIS — Z0000 Encounter for general adult medical examination without abnormal findings: Secondary | ICD-10-CM

## 2018-10-24 DIAGNOSIS — E6609 Other obesity due to excess calories: Secondary | ICD-10-CM

## 2018-10-24 DIAGNOSIS — M1A0790 Idiopathic chronic gout, unspecified ankle and foot, without tophus (tophi): Secondary | ICD-10-CM

## 2018-10-24 DIAGNOSIS — I10 Essential (primary) hypertension: Secondary | ICD-10-CM

## 2018-10-24 DIAGNOSIS — R319 Hematuria, unspecified: Secondary | ICD-10-CM

## 2018-10-24 DIAGNOSIS — M549 Dorsalgia, unspecified: Secondary | ICD-10-CM | POA: Diagnosis not present

## 2018-10-24 DIAGNOSIS — R739 Hyperglycemia, unspecified: Secondary | ICD-10-CM

## 2018-10-24 LAB — CBC
HCT: 45.3 % (ref 39.0–52.0)
Hemoglobin: 15.1 g/dL (ref 13.0–17.0)
MCHC: 33.3 g/dL (ref 30.0–36.0)
MCV: 88.9 fl (ref 78.0–100.0)
Platelets: 256 10*3/uL (ref 150.0–400.0)
RBC: 5.09 Mil/uL (ref 4.22–5.81)
RDW: 13.2 % (ref 11.5–15.5)
WBC: 7.4 10*3/uL (ref 4.0–10.5)

## 2018-10-24 LAB — COMPREHENSIVE METABOLIC PANEL
ALT: 31 U/L (ref 0–53)
AST: 22 U/L (ref 0–37)
Albumin: 4.3 g/dL (ref 3.5–5.2)
Alkaline Phosphatase: 51 U/L (ref 39–117)
BUN: 14 mg/dL (ref 6–23)
CHLORIDE: 104 meq/L (ref 96–112)
CO2: 29 meq/L (ref 19–32)
CREATININE: 0.99 mg/dL (ref 0.40–1.50)
Calcium: 9.2 mg/dL (ref 8.4–10.5)
GFR: 85.44 mL/min (ref >60.00)
GLUCOSE: 109 mg/dL — AB (ref 70–99)
Potassium: 5.1 mEq/L (ref 3.5–5.1)
SODIUM: 138 meq/L (ref 135–145)
Total Bilirubin: 0.8 mg/dL (ref 0.2–1.2)
Total Protein: 6.5 g/dL (ref 6.0–8.3)

## 2018-10-24 LAB — LIPID PANEL
CHOL/HDL RATIO: 4
Cholesterol: 169 mg/dL (ref 0–200)
HDL: 46.3 mg/dL (ref >39.00)
LDL CALC: 104 mg/dL — AB (ref 0–99)
NonHDL: 122.61
Triglycerides: 95 mg/dL (ref 0.0–149.0)
VLDL: 19 mg/dL (ref 0.0–40.0)

## 2018-10-24 LAB — TSH: TSH: 1.25 u[IU]/mL (ref 0.35–4.50)

## 2018-10-24 LAB — URINALYSIS
BILIRUBIN URINE: NEGATIVE
Ketones, ur: NEGATIVE
Leukocytes, UA: NEGATIVE
Nitrite: NEGATIVE
PH: 6 (ref 5.0–8.0)
Specific Gravity, Urine: 1.015 (ref 1.000–1.030)
TOTAL PROTEIN, URINE-UPE24: NEGATIVE
URINE GLUCOSE: NEGATIVE
Urobilinogen, UA: 0.2 (ref 0.0–1.0)

## 2018-10-24 LAB — PSA: PSA: 1.57 ng/mL (ref 0.10–4.00)

## 2018-10-24 LAB — MICROALBUMIN / CREATININE URINE RATIO
CREATININE, U: 107.4 mg/dL
Microalb Creat Ratio: 0.7 mg/g (ref 0.0–30.0)
Microalb, Ur: <0.7 mg/dL (ref 0.0–1.9)

## 2018-10-24 LAB — HEMOGLOBIN A1C: Hgb A1c MFr Bld: 5.7 % (ref 4.6–6.5)

## 2018-10-24 LAB — URIC ACID: URIC ACID, SERUM: 6.9 mg/dL (ref 4.0–7.8)

## 2018-10-24 MED ORDER — ALLOPURINOL 100 MG PO TABS: 100.0000 mg | ORAL_TABLET | Freq: Every day | ORAL | 3 refills | Status: DC | PRN

## 2018-10-24 MED ORDER — DICLOFENAC SODIUM 75 MG PO TBEC: 75.0000 mg | DELAYED_RELEASE_TABLET | Freq: Two times a day (BID) | ORAL | 1 refills | Status: DC

## 2018-10-24 MED ORDER — METOPROLOL SUCCINATE ER 25 MG PO TB24: 25.0000 mg | ORAL_TABLET | Freq: Every day | ORAL | 4 refills | Status: DC

## 2018-10-24 MED ORDER — LOSARTAN POTASSIUM 50 MG PO TABS: 50.0000 mg | ORAL_TABLET | Freq: Every day | ORAL | 4 refills | Status: DC

## 2018-10-24 NOTE — Telephone Encounter (Signed)
Copied from [place] [phone]. Topic: Quick Communication - Rx Refill/Question >> Oct 24, 2018  2:50 PM Scherrie Gerlach wrote: Medication: diclofenac (VOLTAREN) 75 MG EC tablet   Pt seen this am and states he wanted this med refilled when he gets gout. [facility] [address], [place] [phone] (Phone) [phone] (Fax)

## 2018-10-24 NOTE — Progress Notes (Signed)
Subjective:    Patient ID: Joseph [name], male    DOB: [birth year], 47 y.o.   MRN: [id number]  No chief complaint on file.   HPI Patient is in today for annual preventative exam and follow-up on chronic medical concerns including hypertension, hyperlipidemia and obesity.  He is frustrated regarding his weight gain since his last visit but acknowledges working too many hours and thus not exercising.  He also acknowledges eating more red meat and carbohydrates the last few months.  No recent febrile illness or acute concerns.  He is managing his activities of daily living well.  Does believe he is having a flare in his gout and notes some right foot pain as a result.  No changes to his diet or his fluid intake recently. Denies CP/palp/SOB/HA/congestion/fevers/GI or GU c/o. Taking meds as prescribed  Past Medical History:  Diagnosis Date  . Back pain 03/24/2017  . 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 Glaucoma   . Gout   . History of degenerative disc disease 03/24/2017  . History of kidney stones 03/24/2017  . HTN (hypertension) 10/10/2017  . Hypertension   . Lipoma 03/24/2017  . Preventative health care 10/10/2017  . Tinnitus 03/24/2017    Past Surgical History:  Procedure Laterality Date  . APPENDECTOMY      Family History  Problem Relation Age of Onset  . Diabetes Mother   . Glaucoma Father   . Hypertension Father     Social History   Socioeconomic History  . Marital status: Single    Spouse name: Not on file  . Number of children: Not on file  . Years of education: Not on file  . Highest education level: Not on file  Occupational History  . Not on file  Social Needs  . Financial resource strain: Not on file  . Food insecurity:    Worry: Not on file    Inability: Not on file  . Transportation needs:    Medical: Not on file    Non-medical: Not on file  Tobacco Use  . Smoking status: Never Smoker  . Smokeless tobacco: Never Used  Substance and Sexual Activity  .  Alcohol use: Yes    Alcohol/week: 2.0 standard drinks    Types: 2 Cans of beer per week  . Drug use: No  . Sexual activity: Not on file  Lifestyle  . Physical activity:    Days per week: Not on file    Minutes per session: Not on file  . Stress: Not on file  Relationships  . Social connections:    Talks on phone: Not on file    Gets together: Not on file    Attends religious service: Not on file    Active member of club or organization: Not on file    Attends meetings of clubs or organizations: Not on file    Relationship status: Not on file  . Intimate partner violence:    Fear of current or ex partner: Not on file    Emotionally abused: Not on file    Physically abused: Not on file    Forced sexual activity: Not on file  Other Topics Concern  . Not on file  Social History Narrative   Works as Marine scientist, no dietary restrictions.    Outpatient Medications Prior to Visit  Medication Sig Dispense Refill  . dorzolamide-timolol (COSOPT) 22.3-6.8 MG/ML ophthalmic solution Place 1 drop into both eyes 2 (two) times daily.      Marland Kitchen  NON FORMULARY [facility]  Onychomycosis Nail Lacquer -  Fluconazole 2%, Terbinafine 1% DMSO Apply to affected nail once daily Qty. 120 gm 3 refills    . allopurinol (ZYLOPRIM) 100 MG tablet Take 1 tablet (100 mg total) by mouth daily. 30 tablet 3  . ALPRAZolam (XANAX) 0.25 MG tablet Take 1 tablet (0.25 mg total) by mouth 2 (two) times daily as needed for anxiety. 10 tablet 0  . losartan (COZAAR) 50 MG tablet TAKE ONE TABLET BY MOUTH ONE TIME DAILY  30 tablet 4  . methylPREDNISolone (MEDROL) 4 MG tablet 5 tab po qd X 1d then 4 tab po qd X 1d then 3 tab po qd X 1d then 2 tab po qd then 1 tab po qd 15 tablet 0  . metoprolol succinate (TOPROL-XL) 25 MG 24 hr tablet TAKE ONE TABLET BY MOUTH ONE TIME DAILY  30 tablet 4   No facility-administered medications prior to visit.     No Known Allergies  Review of Systems    Constitutional: Negative for chills, fever and malaise/fatigue.  HENT: Negative for congestion and hearing loss.   Eyes: Negative for discharge.  Respiratory: Negative for cough, sputum production and shortness of breath.   Cardiovascular: Negative for chest pain, palpitations and leg swelling.  Gastrointestinal: Negative for abdominal pain, blood in stool, constipation, diarrhea, heartburn, nausea and vomiting.  Genitourinary: Negative for dysuria, frequency, hematuria and urgency.  Musculoskeletal: Positive for back pain and joint pain. Negative for falls and myalgias.  Skin: Negative for rash.  Neurological: Negative for dizziness, sensory change, loss of consciousness, weakness and headaches.  Endo/Heme/Allergies: Negative for environmental allergies. Does not bruise/bleed easily.  Psychiatric/Behavioral: Negative for depression and suicidal ideas. The patient is not nervous/anxious and does not have insomnia.        Objective:    Physical Exam  Constitutional: He is oriented to person, place, and time. He appears well-developed and well-nourished. No distress.  HENT:  Head: Normocephalic and atraumatic.  Right Ear: External ear normal.  Left Ear: External ear normal.  Nose: Nose normal.  Mouth/Throat: Oropharynx is clear and moist.  Eyes: Pupils are equal, round, and reactive to light. Conjunctivae and EOM are normal. Right eye exhibits no discharge. Left eye exhibits no discharge.  Neck: Normal range of motion. Neck supple. No thyromegaly present.  Cardiovascular: Normal rate and regular rhythm.  No murmur heard. Pulmonary/Chest: Breath sounds normal. No respiratory distress.  Abdominal: Soft. Bowel sounds are normal. He exhibits no mass. There is no tenderness. There is no guarding.  Musculoskeletal: He exhibits no edema.  Lymphadenopathy:    He has no cervical adenopathy.  Neurological: He is alert and oriented to person, place, and time. He displays normal reflexes. No  cranial nerve deficit. Coordination normal.  Skin: Skin is warm and dry.  Skin tag right lower abdomen. No erythema. Flesh colored  Psychiatric: He has a normal mood and affect.  Nursing note and vitals reviewed.   BP 118/82 (BP Location: Left Arm, Patient Position: Sitting, Cuff Size: Normal)   Pulse 72   Temp 97.9 F (36.6 C) (Oral)   Resp 18   Ht 5\' 8"  (1.727 m)   Wt 241 lb (109.3 kg)   SpO2 98%   BMI 36.64 kg/m  Wt Readings from Last 3 Encounters:  10/24/18 241 lb (109.3 kg)  06/05/18 234 lb (106.1 kg)  03/03/18 230 lb 3.2 oz (104.4 kg)     Lab Results  Component Value Date   WBC  10.4 06/05/2018   HGB 15.4 06/05/2018   HCT 45.0 06/05/2018   PLT 292.0 06/05/2018   GLUCOSE 86 06/05/2018   CHOL 202 (H) 06/05/2018   TRIG 123.0 06/05/2018   HDL 47.60 06/05/2018   LDLCALC 130 (H) 06/05/2018   ALT 26 06/05/2018   AST 20 06/05/2018   NA 138 06/05/2018   K 4.3 06/05/2018   CL 101 06/05/2018   CREATININE 1.03 06/05/2018   BUN 11 06/05/2018   CO2 29 06/05/2018   TSH 2.19 06/05/2018   HGBA1C 5.2 03/24/2017    Lab Results  Component Value Date   TSH 2.19 06/05/2018   Lab Results  Component Value Date   WBC 10.4 06/05/2018   HGB 15.4 06/05/2018   HCT 45.0 06/05/2018   MCV 88.8 06/05/2018   PLT 292.0 06/05/2018   Lab Results  Component Value Date   NA 138 06/05/2018   K 4.3 06/05/2018   CO2 29 06/05/2018   GLUCOSE 86 06/05/2018   BUN 11 06/05/2018   CREATININE 1.03 06/05/2018   BILITOT 0.4 06/05/2018   ALKPHOS 61 06/05/2018   AST 20 06/05/2018   ALT 26 06/05/2018   PROT 7.0 06/05/2018   ALBUMIN 4.6 06/05/2018   CALCIUM 9.8 06/05/2018   GFR 81.75 06/05/2018   Lab Results  Component Value Date   CHOL 202 (H) 06/05/2018   Lab Results  Component Value Date   HDL 47.60 06/05/2018   Lab Results  Component Value Date   LDLCALC 130 (H) 06/05/2018   Lab Results  Component Value Date   TRIG 123.0 06/05/2018   Lab Results  Component Value Date    CHOLHDL 4 06/05/2018   Lab Results  Component Value Date   HGBA1C 5.2 03/24/2017       Assessment & Plan:   Problem List Items Addressed This Visit    Obesity    Encouraged DASH diet, decrease po intake and increase exercise as tolerated. Needs 7-8 hours of sleep nightly. Avoid trans fats, eat small, frequent meals every 4-5 hours with lean proteins, complex carbs and healthy fats. Minimize simple carbs      Back pain    Encouraged moist heat and gentle stretching as tolerated. May try NSAIDs and prescription meds as directed and report if symptoms worsen or seek immediate care. He finds massage therapy the most helpful.      Hyperlipidemia, mixed    Encouraged heart healthy diet, increase exercise, avoid trans fats, consider a krill oil cap daily      Relevant Medications   losartan (COZAAR) 50 MG tablet   metoprolol succinate (TOPROL-XL) 25 MG 24 hr tablet   HTN (hypertension)    Well controlled, no changes to meds. Encouraged heart healthy diet such as the DASH diet and exercise as tolerated.       Relevant Medications   losartan (COZAAR) 50 MG tablet   metoprolol succinate (TOPROL-XL) 25 MG 24 hr tablet   Preventative health care    Patient encouraged to maintain heart healthy diet, regular exercise, adequate sleep. Consider daily probiotics. Take medications as prescribed. Given and reviewed copy of ACP documents from [facility] and encouraged to complete and return         I have discontinued Kashden Roa's ALPRAZolam and methylPREDNISolone. I have also changed his allopurinol, losartan, and metoprolol succinate. Additionally, I am having him maintain his dorzolamide-timolol and NON FORMULARY.  Meds ordered this encounter  Medications  . allopurinol (ZYLOPRIM) 100 MG tablet  Sig: Take 1 tablet (100 mg total) by mouth daily as needed.    Dispense:  30 tablet    Refill:  3  . losartan (COZAAR) 50 MG tablet    Sig: Take 1 tablet (50 mg total) by  mouth daily.    Dispense:  30 tablet    Refill:  4  . metoprolol succinate (TOPROL-XL) 25 MG 24 hr tablet    Sig: Take 1 tablet (25 mg total) by mouth daily.    Dispense:  30 tablet    Refill:  4     Penni Homans, MD

## 2018-10-24 NOTE — Assessment & Plan Note (Signed)
Encouraged DASH diet, decrease po intake and increase exercise as tolerated. Needs 7-8 hours of sleep nightly. Avoid trans fats, eat small, frequent meals every 4-5 hours with lean proteins, complex carbs and healthy fats. Minimize simple carbs 

## 2018-10-24 NOTE — Patient Instructions (Signed)
Preventive Care 40-64 Years, Male Preventive care refers to lifestyle choices and visits with your health care provider that can promote health and wellness. What does preventive care include?  A yearly physical exam. This is also called an annual well check.  Dental exams once or twice a year.  Routine eye exams. Ask your health care provider how often you should have your eyes checked.  Personal lifestyle choices, including: ? Daily care of your teeth and gums. ? Regular physical activity. ? Eating a healthy diet. ? Avoiding tobacco and drug use. ? Limiting alcohol use. ? Practicing safe sex. ? Taking low-dose aspirin every day starting at age 49. What happens during an annual well check? The services and screenings done by your health care provider during your annual well check will depend on your age, overall health, lifestyle risk factors, and family history of disease. Counseling Your health care provider may ask you questions about your:  Alcohol use.  Tobacco use.  Drug use.  Emotional well-being.  Home and relationship well-being.  Sexual activity.  Eating habits.  Work and work Statistician.  Screening You may have the following tests or measurements:  Height, weight, and BMI.  Blood pressure.  Lipid and cholesterol levels. These may be checked every 5 years, or more frequently if you are over 49 years old.  Skin check.  Lung cancer screening. You may have this screening every year starting at age 49 if you have a 30-pack-year history of smoking and currently smoke or have quit within the past 15 years.  Fecal occult blood test (FOBT) of the stool. You may have this test every year starting at age 49.  Flexible sigmoidoscopy or colonoscopy. You may have a sigmoidoscopy every 5 years or a colonoscopy every 10 years starting at age 49.  Prostate cancer screening. Recommendations will vary depending on your family history and other risks.  Hepatitis C  blood test.  Hepatitis B blood test.  Sexually transmitted disease (STD) testing.  Diabetes screening. This is done by checking your blood sugar (glucose) after you have not eaten for a while (fasting). You may have this done every 1-3 years.  Discuss your test results, treatment options, and if necessary, the need for more tests with your health care provider. Vaccines Your health care provider may recommend certain vaccines, such as:  Influenza vaccine. This is recommended every year.  Tetanus, diphtheria, and acellular pertussis (Tdap, Td) vaccine. You may need a Td booster every 10 years.  Varicella vaccine. You may need this if you have not been vaccinated.  Zoster vaccine. You may need this after age 49.  Measles, mumps, and rubella (MMR) vaccine. You may need at least one dose of MMR if you were born in 1957 or later. You may also need a second dose.  Pneumococcal 13-valent conjugate (PCV13) vaccine. You may need this if you have certain conditions and have not been vaccinated.  Pneumococcal polysaccharide (PPSV23) vaccine. You may need one or two doses if you smoke cigarettes or if you have certain conditions.  Meningococcal vaccine. You may need this if you have certain conditions.  Hepatitis A vaccine. You may need this if you have certain conditions or if you travel or work in places where you may be exposed to hepatitis A.  Hepatitis B vaccine. You may need this if you have certain conditions or if you travel or work in places where you may be exposed to hepatitis B.  Haemophilus influenzae type b (Hib) vaccine.  You may need this if you have certain risk factors.  Talk to your health care provider about which screenings and vaccines you need and how often you need them. This information is not intended to replace advice given to you by your health care provider. Make sure you discuss any questions you have with your health care provider. Document Released: 01/09/2016  Document Revised: 09/01/2016 Document Reviewed: 10/14/2015 Elsevier Interactive Patient Education  [employer].

## 2018-10-24 NOTE — Assessment & Plan Note (Signed)
Flared today in right great toe. Check uric acid

## 2018-10-24 NOTE — Assessment & Plan Note (Signed)
Encouraged moist heat and gentle stretching as tolerated. May try NSAIDs and prescription meds as directed and report if symptoms worsen or seek immediate care. He finds massage therapy the most helpful.

## 2018-10-24 NOTE — Assessment & Plan Note (Signed)
Well controlled, no changes to meds. Encouraged heart healthy diet such as the DASH diet and exercise as tolerated.  °

## 2018-10-24 NOTE — Assessment & Plan Note (Signed)
Encouraged heart healthy diet, increase exercise, avoid trans fats, consider a krill oil cap daily 

## 2018-10-24 NOTE — Telephone Encounter (Signed)
Medication sent in. 

## 2018-10-24 NOTE — Assessment & Plan Note (Signed)
Patient encouraged to maintain heart healthy diet, regular exercise, adequate sleep. Consider daily probiotics. Take medications as prescribed. Given and reviewed copy of ACP documents from [place] Secretary of State and encouraged to complete and return 

## 2018-10-25 DIAGNOSIS — H401131 Primary open-angle glaucoma, bilateral, mild stage: Secondary | ICD-10-CM | POA: Diagnosis not present

## 2018-10-25 DIAGNOSIS — H2513 Age-related nuclear cataract, bilateral: Secondary | ICD-10-CM | POA: Diagnosis not present

## 2018-10-25 DIAGNOSIS — H5213 Myopia, bilateral: Secondary | ICD-10-CM | POA: Diagnosis not present

## 2018-10-25 DIAGNOSIS — H25013 Cortical age-related cataract, bilateral: Secondary | ICD-10-CM | POA: Diagnosis not present

## 2018-10-25 DIAGNOSIS — H04123 Dry eye syndrome of bilateral lacrimal glands: Secondary | ICD-10-CM | POA: Diagnosis not present

## 2018-12-03 ENCOUNTER — Encounter: Payer: Self-pay | Admitting: Family Medicine

## 2019-02-14 ENCOUNTER — Encounter: Payer: Self-pay | Admitting: Family Medicine

## 2019-02-14 ENCOUNTER — Ambulatory Visit: Payer: BLUE CROSS/BLUE SHIELD | Admitting: Family Medicine

## 2019-02-14 ENCOUNTER — Other Ambulatory Visit: Payer: Self-pay | Admitting: Family Medicine

## 2019-02-14 ENCOUNTER — Ambulatory Visit: Payer: Self-pay

## 2019-02-14 VITALS — BP 120/84 | HR 68 | Temp 98.5°F | Ht 69.0 in | Wt 241.1 lb

## 2019-02-14 DIAGNOSIS — M109 Gout, unspecified: Secondary | ICD-10-CM

## 2019-02-14 LAB — URIC ACID: URIC ACID, SERUM: 6.7 mg/dL (ref 4.0–7.8)

## 2019-02-14 MED ORDER — PREDNISONE 20 MG PO TABS: 40.0000 mg | ORAL_TABLET | Freq: Every day | ORAL | 0 refills | Status: AC

## 2019-02-14 NOTE — Patient Instructions (Signed)
Give [place] 2-3 business days to get the results of your labs back.   Ice/cold pack over area for 10-15 min twice daily.  OK to take Tylenol 1000 mg (2 extra strength tabs) or 975 mg (3 regular strength tabs) every 6 hours as needed.  Stop using diclofenac while on the prednisone.  You aren't force to take the allopurinol, but it could help prevent future flares and uric acid deposition into joints causing deformity.  Let us know if you need anything.

## 2019-02-14 NOTE — Progress Notes (Signed)
Musculoskeletal Exam  Patient: Joseph [name] DOB: [birth year]  DOS: 02/14/2019  SUBJECTIVE:  Chief Complaint:   Chief Complaint  Patient presents with  . Pain  . Edema    Joseph [name] is a 48 y.o.  male for evaluation and treatment of L foot pain.   Onset:  4 days ago. No inj or change in activity.  Location: Bottom of left foot near the toes Character:  aching  Progression of issue:  has slightly improved Associated symptoms: swelling Treatment: to date has been prescription NSAIDS.  He is not compliant with his allopurinol. Neurovascular symptoms: no  ROS: Musculoskeletal/Extremities: +L foot pain  Past Medical History:  Diagnosis Date  . Back pain 03/24/2017  . 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 Glaucoma   . Gout   . History of degenerative disc disease 03/24/2017  . History of kidney stones 03/24/2017  . HTN (hypertension) 10/10/2017  . Hypertension   . Lipoma 03/24/2017  . Preventative health care 10/10/2017  . Tinnitus 03/24/2017    Objective: VITAL SIGNS: BP 120/84 (BP Location: Left Arm, Patient Position: Sitting, Cuff Size: Large)   Pulse 68   Temp 98.5 F (36.9 C) (Oral)   Ht 5\' 9"  (1.753 m)   Wt 241 lb 2 oz (109.4 kg)   SpO2 98%   BMI 35.61 kg/m  Constitutional: Well formed, well developed. No acute distress. Cardiovascular: Brisk cap refill Thorax & Lungs: No accessory muscle use Musculoskeletal: L foot.   Tenderness to palpation: yes-over second, third, and fourth plantar MTP Deformity: no Ecchymosis: no +edema No excessive warmth Neurologic: Normal sensory function. No focal deficits noted.  Psychiatric: Normal mood. Age appropriate judgment and insight. Alert & oriented x 3.    Assessment:  Acute gout of left foot, unspecified cause - Plan: Uric acid, predniSONE (DELTASONE) 20 MG tablet  Plan: Prednisone burst, check uric acid.  If he does not wish to undergo maintenance therapy, I will defer further management to his reg PCP. F/u  prn. The patient voiced understanding and agreement to the plan.   [place], DO 02/14/19  3:28 PM

## 2019-02-14 NOTE — Telephone Encounter (Signed)
Pt c/o pain to the bottom of the left foot. Pt c/o mild swelling and slight redness. Pt has been walking on his heel to avoid pain with walking. No fever, leg pain, rash, numbness.  Care advice given and pt verbalized understanding. No openings today with PCP. Appt given this afternoon with Dr Nani Ravens.  Reason for Disposition . [1] Swollen foot AND [2] no fever  (Exceptions: localized bump from bunions, calluses, insect bite, sting)  Answer Assessment - Initial Assessment Questions 1. ONSET: "When did the pain start?"     Friday 2. LOCATION: "Where is the pain located?"      Bottom of foot  3. PAIN: "How bad is the pain?"    (Scale 1-10; or mild, moderate, severe)   -  MILD (1-3): doesn't interfere with normal activities    -  MODERATE (4-7): interferes with normal activities (e.g., work or school) or awakens from sleep, limping    -  SEVERE (8-10): excruciating pain, unable to do any normal activities, unable to walk     Moderate- walking on heel 4. WORK OR EXERCISE: "Has there been any recent work or exercise that involved this part of the body?"      no 5. CAUSE: "What do you think is causing the foot pain?"     Gout 6. OTHER SYMPTOMS: "Do you have any other symptoms?" (e.g., leg pain, rash, fever, numbness)     no 7. PREGNANCY: "Is there any chance you are pregnant?" "When was your last menstrual period?"     n/a  Protocols used: FOOT PAIN-A-AH

## 2019-02-20 ENCOUNTER — Encounter: Payer: Self-pay | Admitting: Family

## 2019-02-20 ENCOUNTER — Ambulatory Visit: Payer: BLUE CROSS/BLUE SHIELD | Admitting: Family

## 2019-02-20 VITALS — BP 127/96 | HR 75 | Temp 98.6°F | Resp 16 | Ht 69.0 in | Wt 238.6 lb

## 2019-02-20 DIAGNOSIS — M79672 Pain in left foot: Secondary | ICD-10-CM | POA: Diagnosis not present

## 2019-02-20 MED ORDER — COLCHICINE 0.6 MG PO TABS: ORAL_TABLET | ORAL | 0 refills | Status: DC

## 2019-02-20 MED ORDER — MELOXICAM 7.5 MG PO TABS: 7.5000 mg | ORAL_TABLET | Freq: Every day | ORAL | 0 refills | Status: DC

## 2019-02-20 NOTE — Patient Instructions (Addendum)
Please begin colchicine. Begin meloxicam for pain as needed. Call if symptoms worsen or if not improved in 2-3 days.

## 2019-02-20 NOTE — Progress Notes (Signed)
Subjective:     Patient ID: Joseph [name], male   DOB: 1971-11-29, 48 y.o.   MRN: [id number]  HPI  Patient is a 48 yr old male who presents today for follow up of foot pain.  Was treated for gout flare on  2/19 with pred burst.  Uric acid level was 6.7 at that time. Patient reports that it only hurts when he walks.  Notes that prednisone helped the swelling but pain is unchanged. Tylenol is helping his pain. Denies associated pain.     Review of Systems See HPI  Past Medical History:  Diagnosis Date  . Back pain 03/24/2017  . 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 Glaucoma   . Gout   . History of degenerative disc disease 03/24/2017  . History of kidney stones 03/24/2017  . HTN (hypertension) 10/10/2017  . Hypertension   . Lipoma 03/24/2017  . Preventative health care 10/10/2017  . Tinnitus 03/24/2017     Social History   Socioeconomic History  . Marital status: Single    Spouse name: Not on file  . Number of children: Not on file  . Years of education: Not on file  . Highest education level: Not on file  Occupational History  . Not on file  Social Needs  . Financial resource strain: Not on file  . Food insecurity:    Worry: Not on file    Inability: Not on file  . Transportation needs:    Medical: Not on file    Non-medical: Not on file  Tobacco Use  . Smoking status: Never Smoker  . Smokeless tobacco: Never Used  Substance and Sexual Activity  . Alcohol use: Yes    Alcohol/week: 2.0 standard drinks    Types: 2 Cans of beer per week  . Drug use: No  . Sexual activity: Not on file  Lifestyle  . Physical activity:    Days per week: Not on file    Minutes per session: Not on file  . Stress: Not on file  Relationships  . Social connections:    Talks on phone: Not on file    Gets together: Not on file    Attends religious service: Not on file    Active member of club or organization: Not on file    Attends meetings of clubs or organizations: Not on file     Relationship status: Not on file  . Intimate partner violence:    Fear of current or ex partner: Not on file    Emotionally abused: Not on file    Physically abused: Not on file    Forced sexual activity: Not on file  Other Topics Concern  . Not on file  Social History Narrative   Works as Marine scientist, no dietary restrictions.    Past Surgical History:  Procedure Laterality Date  . APPENDECTOMY      Family History  Problem Relation Age of Onset  . Diabetes Mother   . Glaucoma Father   . Hypertension Father     No Known Allergies  Current Outpatient Medications on File Prior to Visit  Medication Sig Dispense Refill  . diclofenac (VOLTAREN) 75 MG EC tablet Take 1 tablet (75 mg total) by mouth 2 (two) times daily. Used for gout flare and take with food. 30 tablet 1  . dorzolamide-timolol (COSOPT) 22.3-6.8 MG/ML ophthalmic solution Place 1 drop into both eyes 2 (two) times daily.      Marland Kitchen losartan (COZAAR) 50 MG tablet  Take 1 tablet (50 mg total) by mouth daily. 30 tablet 4  . metoprolol succinate (TOPROL-XL) 25 MG 24 hr tablet Take 1 tablet (25 mg total) by mouth daily. 30 tablet 4  . NON FORMULARY [facility]  Onychomycosis Nail Lacquer -  Fluconazole 2%, Terbinafine 1% DMSO Apply to affected nail once daily Qty. 120 gm 3 refills     No current facility-administered medications on file prior to visit.     BP (!) 127/96 (BP Location: Left Arm, Patient Position: Sitting, Cuff Size: Large)   Pulse 75   Temp 98.6 F (37 C) (Oral)   Resp 16   Ht 5\' 9"  (1.753 m)   Wt 238 lb 9.6 oz (108.2 kg)   SpO2 99%   BMI 35.24 kg/m       Objective:   Physical Exam Constitutional:      Appearance: Normal appearance.  Musculoskeletal:     Comments: Left foot is without swelling or erythema  Neurological:     Mental Status: He is alert.        Assessment:    Foot pain- concerning for gout which has not been completely treated. Advised pt as  follows: Please begin colchicine. Begin meloxicam for pain as needed. Call if symptoms worsen or if not improved in 2-3 days.      Plan:

## 2019-03-06 ENCOUNTER — Encounter: Payer: Self-pay | Admitting: Family Medicine

## 2019-03-12 ENCOUNTER — Encounter: Payer: Self-pay | Admitting: Family Medicine

## 2019-03-12 MED ORDER — LOSARTAN POTASSIUM 50 MG PO TABS: 50.0000 mg | ORAL_TABLET | Freq: Every day | ORAL | 1 refills | Status: DC

## 2019-03-12 MED ORDER — METOPROLOL SUCCINATE ER 25 MG PO TB24: 25.0000 mg | ORAL_TABLET | Freq: Every day | ORAL | 1 refills | Status: DC

## 2019-03-15 ENCOUNTER — Encounter: Payer: Self-pay | Admitting: Family Medicine

## 2019-03-15 ENCOUNTER — Other Ambulatory Visit: Payer: Self-pay

## 2019-03-15 DIAGNOSIS — M1A0790 Idiopathic chronic gout, unspecified ankle and foot, without tophus (tophi): Secondary | ICD-10-CM

## 2019-03-15 MED ORDER — DICLOFENAC SODIUM 75 MG PO TBEC: 75.0000 mg | DELAYED_RELEASE_TABLET | Freq: Two times a day (BID) | ORAL | 1 refills | Status: DC

## 2019-03-15 MED ORDER — ALLOPURINOL 100 MG PO TABS: 100.0000 mg | ORAL_TABLET | Freq: Every day | ORAL | 0 refills | Status: DC | PRN

## 2019-04-20 ENCOUNTER — Telehealth: Payer: Self-pay | Admitting: Family Medicine

## 2019-04-20 NOTE — Telephone Encounter (Signed)
Copied from [place] [id number]. Topic: Referral - Request for Referral >> Apr 20, 2019  9:37 AM Blase Mess A wrote: Has patient seen PCP for this complaint? Yes Ear whistling  *If NO, is insurance requiring patient see PCP for this issue before PCP can refer them? Referral for which specialty: Ear Dr Preferred provider/office: No preferred location [place]. Prefer doctor that speaks [place]. Reason for referral: Ear Whistling for about 5 years

## 2019-04-20 NOTE — Telephone Encounter (Signed)
Patient has appt with PCP on 04/23/19

## 2019-04-23 ENCOUNTER — Ambulatory Visit (INDEPENDENT_AMBULATORY_CARE_PROVIDER_SITE_OTHER): Payer: BLUE CROSS/BLUE SHIELD | Admitting: Family Medicine

## 2019-04-23 ENCOUNTER — Other Ambulatory Visit: Payer: Self-pay

## 2019-04-23 DIAGNOSIS — H9313 Tinnitus, bilateral: Secondary | ICD-10-CM

## 2019-04-23 DIAGNOSIS — M542 Cervicalgia: Secondary | ICD-10-CM

## 2019-04-23 DIAGNOSIS — I10 Essential (primary) hypertension: Secondary | ICD-10-CM | POA: Diagnosis not present

## 2019-04-23 DIAGNOSIS — Z7189 Other specified counseling: Secondary | ICD-10-CM

## 2019-04-23 HISTORY — DX: Other specified counseling: Z71.89

## 2019-04-23 HISTORY — DX: Cervicalgia: M54.2

## 2019-04-23 MED ORDER — FLUTICASONE PROPIONATE 50 MCG/ACT NA SUSP: 2.0000 | Freq: Every day | NASAL | 5 refills | Status: DC | PRN

## 2019-04-23 MED ORDER — CETIRIZINE HCL 10 MG PO TABS: 10.0000 mg | ORAL_TABLET | Freq: Every day | ORAL | 5 refills | Status: DC | PRN

## 2019-04-23 MED ORDER — TIZANIDINE HCL 4 MG PO TABS: 2.0000 mg | ORAL_TABLET | Freq: Two times a day (BID) | ORAL | 2 refills | Status: DC | PRN

## 2019-04-23 NOTE — Assessment & Plan Note (Signed)
Well controlled, no changes to meds. Encouraged heart healthy diet such as the DASH diet and exercise as tolerated.  °

## 2019-04-23 NOTE — Assessment & Plan Note (Signed)
Encouraged moist heat and gentle stretching as tolerated. May try NSAIDs and prescription meds as directed and report if symptoms worsen or seek immediate care. Notes spasm all along SCM L>R, Tizanidine 2-4 mg bid. Report if no improvement

## 2019-04-23 NOTE — Assessment & Plan Note (Signed)
Long standing but noting more cracklin start Cetirizine QD, Flonase QD and valsalva maneuver if no improvement will refer to ENT

## 2019-04-23 NOTE — Assessment & Plan Note (Signed)
Encouraged Quarantine, masks, gloves, buy a pulse ox, Vitamin C tid and zinc

## 2019-04-23 NOTE — Progress Notes (Signed)
Virtual Visit via Video Note  I connected with [name] on 04/23/19 at  9:00 AM EDT by a video enabled telemedicine application and verified that I am speaking with the correct person using two identifiers.   I discussed the limitations of evaluation and management by telemedicine and the availability of in person appointments. The patient expressed understanding and agreed to proceed. Joseph [name] CMA was able to get patient set up on video visit    Subjective:    Patient ID: Joseph [name], male    DOB: [birth year], 48 y.o.   MRN: [id number]  No chief complaint on file.   HPI Patient is in today for evaluation of neck pain persistent ringing or crackling in his ears as well as hypertension, and more. He is noting trouble with bilateral neck pain and stiffness worse on ledt than right. No fall or trauma. Pain is limiting the ROM in the shoulders because the muscles in his neck spasm. Advil 400 mg twice daily helps for a time but then pain returns. No radicular symptoms to hands. Also notes persistent noise in his ears he describes as crackling today, his wife is present and also notes he seems to not hear as well but he implies he is not always listening. Denies CP/palp/SOB/HA/congestion/fevers/GI or GU c/o. Taking meds as prescribed  Past Medical History:  Diagnosis Date  . Back pain 03/24/2017  . 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 Glaucoma   . Gout   . History of degenerative disc disease 03/24/2017  . History of kidney stones 03/24/2017  . HTN (hypertension) 10/10/2017  . Hypertension   . Lipoma 03/24/2017  . Preventative health care 10/10/2017  . Tinnitus 03/24/2017    Past Surgical History:  Procedure Laterality Date  . APPENDECTOMY      Family History  Problem Relation Age of Onset  . Diabetes Mother   . Glaucoma Father   . Hypertension Father     Social History   Socioeconomic History  . Marital status: Single    Spouse name: Not on file  . Number of  children: Not on file  . Years of education: Not on file  . Highest education level: Not on file  Occupational History  . Not on file  Social Needs  . Financial resource strain: Not on file  . Food insecurity:    Worry: Not on file    Inability: Not on file  . Transportation needs:    Medical: Not on file    Non-medical: Not on file  Tobacco Use  . Smoking status: Never Smoker  . Smokeless tobacco: Never Used  Substance and Sexual Activity  . Alcohol use: Yes    Alcohol/week: 2.0 standard drinks    Types: 2 Cans of beer per week  . Drug use: No  . Sexual activity: Not on file  Lifestyle  . Physical activity:    Days per week: Not on file    Minutes per session: Not on file  . Stress: Not on file  Relationships  . Social connections:    Talks on phone: Not on file    Gets together: Not on file    Attends religious service: Not on file    Active member of club or organization: Not on file    Attends meetings of clubs or organizations: Not on file    Relationship status: Not on file  . Intimate partner violence:    Fear of current or ex partner: Not on file  Emotionally abused: Not on file    Physically abused: Not on file    Forced sexual activity: Not on file  Other Topics Concern  . Not on file  Social History Narrative   Works as Marine scientist, no dietary restrictions.    Outpatient Medications Prior to Visit  Medication Sig Dispense Refill  . allopurinol (ZYLOPRIM) 100 MG tablet Take 1 tablet (100 mg total) by mouth daily as needed. 90 tablet 0  . colchicine 0.6 MG tablet Take 2 tabs by mouth now and 1 tab by mouth in 1 hour. 3 tablet 0  . diclofenac (VOLTAREN) 75 MG EC tablet Take 1 tablet (75 mg total) by mouth 2 (two) times daily. Used for gout flare and take with food. 180 tablet 1  . dorzolamide-timolol (COSOPT) 22.3-6.8 MG/ML ophthalmic solution Place 1 drop into both eyes 2 (two) times daily.      Marland Kitchen losartan (COZAAR) 50 MG tablet Take 1  tablet (50 mg total) by mouth daily. 90 tablet 1  . meloxicam (MOBIC) 7.5 MG tablet Take 1 tablet (7.5 mg total) by mouth daily. 14 tablet 0  . metoprolol succinate (TOPROL-XL) 25 MG 24 hr tablet Take 1 tablet (25 mg total) by mouth daily. 90 tablet 1  . NON FORMULARY [facility]  Onychomycosis Nail Lacquer -  Fluconazole 2%, Terbinafine 1% DMSO Apply to affected nail once daily Qty. 120 gm 3 refills     No facility-administered medications prior to visit.     No Known Allergies  Review of Systems  Constitutional: Negative for fever and malaise/fatigue.  HENT: Positive for congestion, hearing loss and tinnitus. Negative for ear discharge and ear pain.   Eyes: Negative for blurred vision.  Respiratory: Negative for shortness of breath.   Cardiovascular: Negative for chest pain, palpitations and leg swelling.  Gastrointestinal: Negative for abdominal pain, blood in stool and nausea.  Genitourinary: Negative for dysuria and frequency.  Musculoskeletal: Positive for myalgias and neck pain. Negative for falls.  Skin: Negative for rash.  Neurological: Negative for dizziness, loss of consciousness and headaches.  Endo/Heme/Allergies: Negative for environmental allergies.  Psychiatric/Behavioral: Negative for depression. The patient is not nervous/anxious.        Objective:    Physical Exam Vitals signs and nursing note reviewed.  Constitutional:      General: He is not in acute distress.    Appearance: Normal appearance. He is well-developed. He is obese. He is not ill-appearing.  HENT:     Head: Normocephalic and atraumatic.     Nose: Nose normal.  Eyes:     General:        Right eye: No discharge.        Left eye: No discharge.  Neck:     Musculoskeletal: Normal range of motion.  Cardiovascular:     Heart sounds: No murmur.  Pulmonary:     Effort: Pulmonary effort is normal.  Abdominal:     Tenderness: There is no abdominal tenderness.  Skin:    General: Skin is  dry.  Neurological:     Mental Status: He is alert and oriented to person, place, and time.  Psychiatric:        Mood and Affect: Mood normal.        Behavior: Behavior normal.     BP 138/80  Wt Readings from Last 3 Encounters:  02/20/19 238 lb 9.6 oz (108.2 kg)  02/14/19 241 lb 2 oz (109.4 kg)  10/24/18 241 lb (109.3  kg)    Diabetic Foot Exam - Simple   No data filed     Lab Results  Component Value Date   WBC 7.4 10/24/2018   HGB 15.1 10/24/2018   HCT 45.3 10/24/2018   PLT 256.0 10/24/2018   GLUCOSE 109 (H) 10/24/2018   CHOL 169 10/24/2018   TRIG 95.0 10/24/2018   HDL 46.30 10/24/2018   LDLCALC 104 (H) 10/24/2018   ALT 31 10/24/2018   AST 22 10/24/2018   NA 138 10/24/2018   K 5.1 10/24/2018   CL 104 10/24/2018   CREATININE 0.99 10/24/2018   BUN 14 10/24/2018   CO2 29 10/24/2018   TSH 1.25 10/24/2018   PSA 1.57 10/24/2018   HGBA1C 5.7 10/24/2018   MICROALBUR <0.7 10/24/2018    Lab Results  Component Value Date   TSH 1.25 10/24/2018   Lab Results  Component Value Date   WBC 7.4 10/24/2018   HGB 15.1 10/24/2018   HCT 45.3 10/24/2018   MCV 88.9 10/24/2018   PLT 256.0 10/24/2018   Lab Results  Component Value Date   NA 138 10/24/2018   K 5.1 10/24/2018   CO2 29 10/24/2018   GLUCOSE 109 (H) 10/24/2018   BUN 14 10/24/2018   CREATININE 0.99 10/24/2018   BILITOT 0.8 10/24/2018   ALKPHOS 51 10/24/2018   AST 22 10/24/2018   ALT 31 10/24/2018   PROT 6.5 10/24/2018   ALBUMIN 4.3 10/24/2018   CALCIUM 9.2 10/24/2018   GFR 85.44 10/24/2018   Lab Results  Component Value Date   CHOL 169 10/24/2018   Lab Results  Component Value Date   HDL 46.30 10/24/2018   Lab Results  Component Value Date   LDLCALC 104 (H) 10/24/2018   Lab Results  Component Value Date   TRIG 95.0 10/24/2018   Lab Results  Component Value Date   CHOLHDL 4 10/24/2018   Lab Results  Component Value Date   HGBA1C 5.7 10/24/2018       Assessment & Plan:   Problem  List Items Addressed This Visit    Tinnitus    Long standing but noting more cracklin start Cetirizine QD, Flonase QD and valsalva maneuver if no improvement will refer to ENT      HTN (hypertension)    Well controlled, no changes to meds. Encouraged heart healthy diet such as the DASH diet and exercise as tolerated.       Educated About Covid-19 Virus Infection    Encouraged Quarantine, masks, gloves, buy a pulse ox, Vitamin C tid and zinc      Neck pain    Encouraged moist heat and gentle stretching as tolerated. May try NSAIDs and prescription meds as directed and report if symptoms worsen or seek immediate care. Notes spasm all along SCM L>R, Tizanidine 2-4 mg bid. Report if no improvement         I am having [name] start on cetirizine, fluticasone, and tiZANidine. I am also having him maintain his dorzolamide-timolol, NON FORMULARY, colchicine, meloxicam, metoprolol succinate, losartan, diclofenac, and allopurinol.  Meds ordered this encounter  Medications  . cetirizine (ZYRTEC) 10 MG tablet    Sig: Take 1 tablet (10 mg total) by mouth daily as needed for allergies.    Dispense:  30 tablet    Refill:  5  . fluticasone (FLONASE) 50 MCG/ACT nasal spray    Sig: Place 2 sprays into both nostrils daily as needed for allergies or rhinitis.    Dispense:  16 g  Refill:  5  . tiZANidine (ZANAFLEX) 4 MG tablet    Sig: Take 0.5-1 tablets (2-4 mg total) by mouth 2 (two) times daily as needed for muscle spasms.    Dispense:  60 tablet    Refill:  2    I discussed the assessment and treatment plan with the patient. The patient was provided an opportunity to ask questions and all were answered. The patient agreed with the plan and demonstrated an understanding of the instructions.   The patient was advised to call back or seek an in-person evaluation if the symptoms worsen or if the condition fails to improve as anticipated.  I provided 25 minutes of non-face-to-face time  during this encounter.   Penni Homans, MD

## 2019-06-06 ENCOUNTER — Encounter: Payer: Self-pay | Admitting: Family Medicine

## 2019-06-26 ENCOUNTER — Ambulatory Visit: Payer: BLUE CROSS/BLUE SHIELD | Admitting: Family Medicine

## 2019-07-23 ENCOUNTER — Ambulatory Visit: Payer: BLUE CROSS/BLUE SHIELD | Admitting: Family Medicine

## 2019-08-13 ENCOUNTER — Other Ambulatory Visit: Payer: Self-pay | Admitting: Family Medicine

## 2019-08-13 DIAGNOSIS — M1A0790 Idiopathic chronic gout, unspecified ankle and foot, without tophus (tophi): Secondary | ICD-10-CM

## 2019-08-24 ENCOUNTER — Other Ambulatory Visit: Payer: Self-pay | Admitting: Family Medicine

## 2019-08-24 DIAGNOSIS — M1A0790 Idiopathic chronic gout, unspecified ankle and foot, without tophus (tophi): Secondary | ICD-10-CM

## 2019-10-18 IMAGING — DX DG LUMBAR SPINE 2-3V
3 series · 3 of 3 positions shown · non-contrast
Comparison: Radiographs May 12, 2016.

CLINICAL DATA: Low back pain for 2 weeks without known injury.

EXAM:
LUMBAR SPINE - 2-3 VIEW

[l-spine ap]
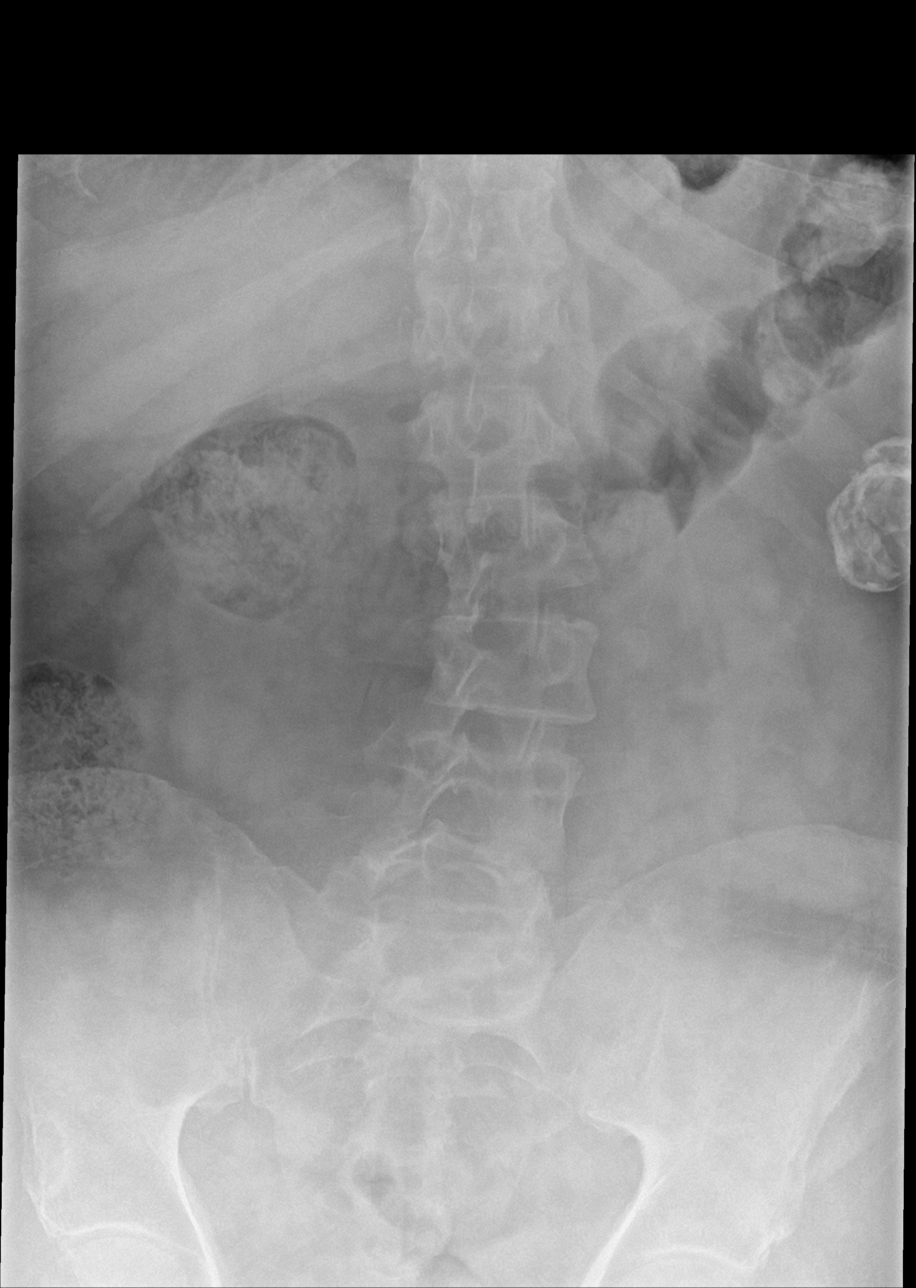

[l-spine lat]
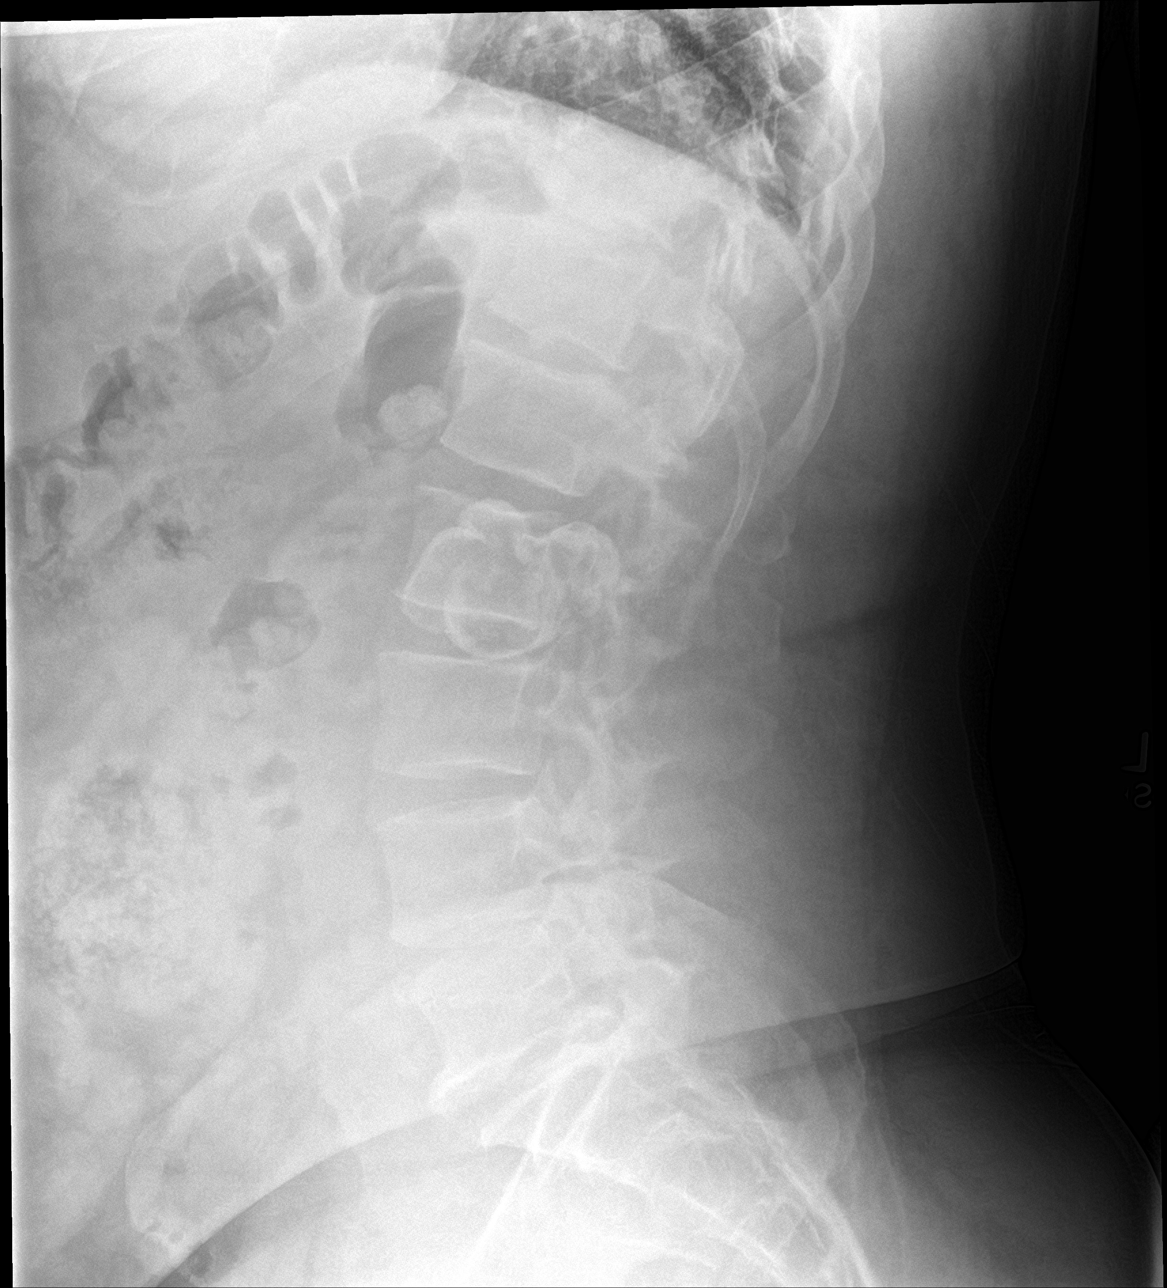

[l-spine spot]
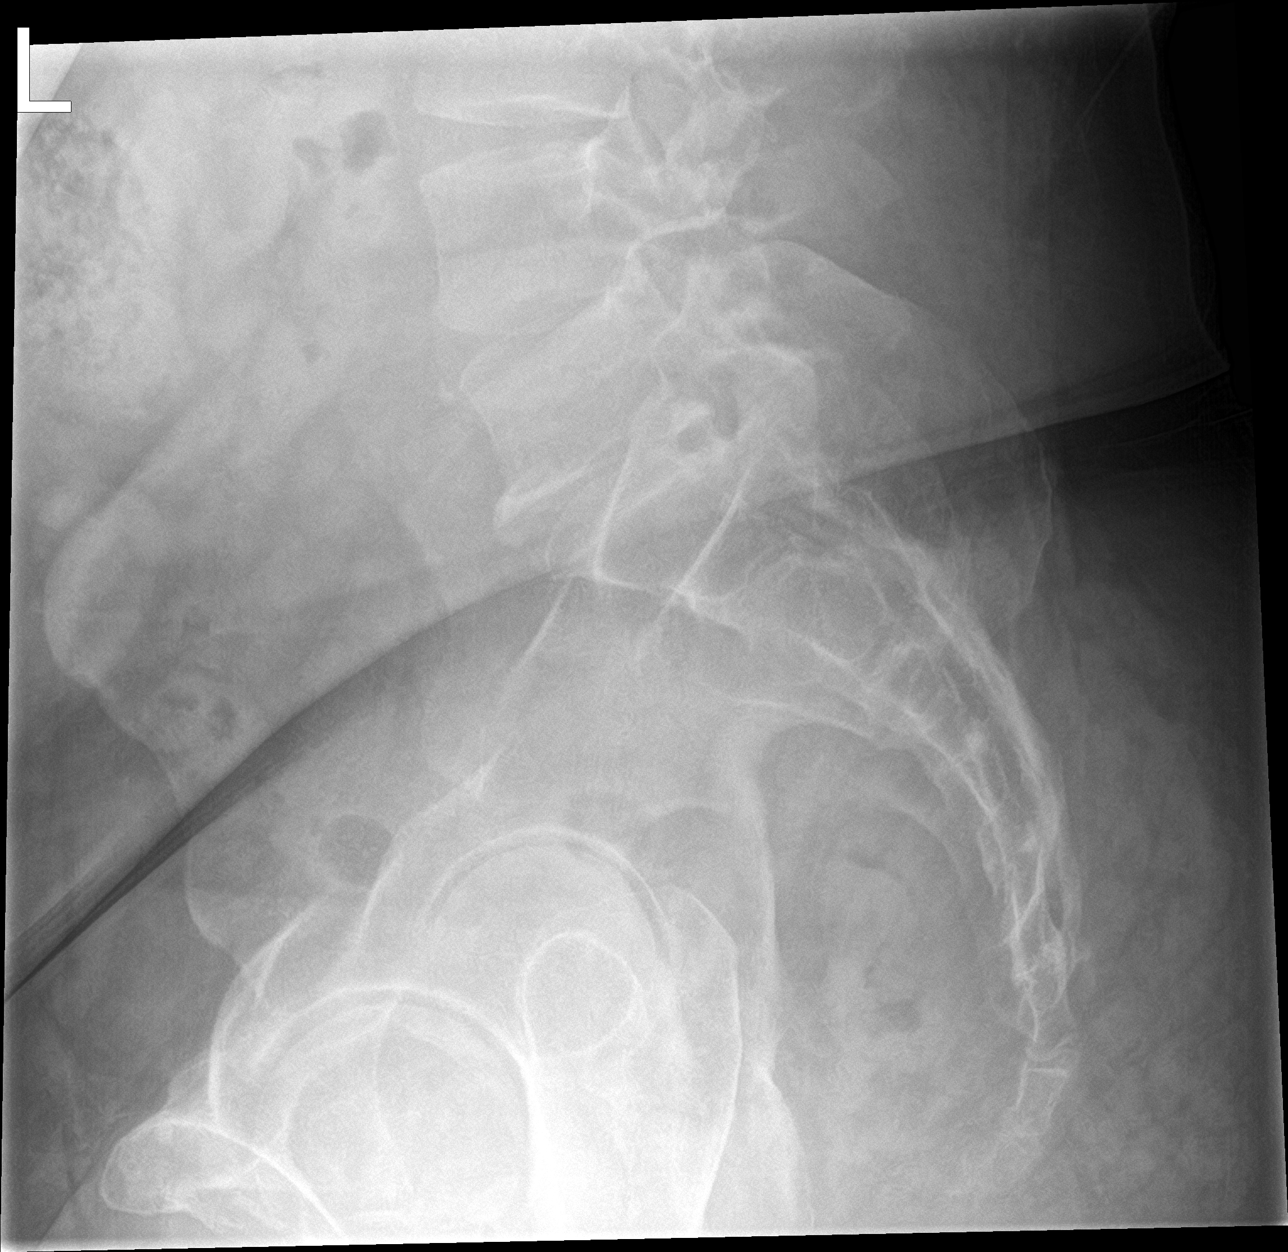

[3 of 3 positions shown; findings below may reference images not displayed]

FINDINGS: Mild levoscoliosis is noted. No fracture or spondylolisthesis is
noted. Mild degenerative disc disease is noted at L4-5 and L5-S1.
IMPRESSION: Mild multilevel degenerative disc disease. No acute abnormality seen
in the lumbar spine.

## 2019-12-05 ENCOUNTER — Other Ambulatory Visit: Payer: Self-pay | Admitting: Family Medicine

## 2020-11-10 ENCOUNTER — Other Ambulatory Visit: Payer: Self-pay

## 2020-11-10 ENCOUNTER — Ambulatory Visit (INDEPENDENT_AMBULATORY_CARE_PROVIDER_SITE_OTHER): Payer: 59 | Admitting: Medical

## 2020-11-10 VITALS — BP 123/74 | HR 78 | Resp 18 | Ht 69.0 in | Wt 229.0 lb

## 2020-11-10 DIAGNOSIS — R350 Frequency of micturition: Secondary | ICD-10-CM

## 2020-11-10 DIAGNOSIS — I10 Essential (primary) hypertension: Secondary | ICD-10-CM | POA: Diagnosis not present

## 2020-11-10 DIAGNOSIS — Z0001 Encounter for general adult medical examination with abnormal findings: Secondary | ICD-10-CM | POA: Diagnosis not present

## 2020-11-10 DIAGNOSIS — F411 Generalized anxiety disorder: Secondary | ICD-10-CM | POA: Diagnosis not present

## 2020-11-10 DIAGNOSIS — M1A0790 Idiopathic chronic gout, unspecified ankle and foot, without tophus (tophi): Secondary | ICD-10-CM | POA: Diagnosis not present

## 2020-11-10 DIAGNOSIS — Z0000 Encounter for general adult medical examination without abnormal findings: Secondary | ICD-10-CM

## 2020-11-10 DIAGNOSIS — E782 Mixed hyperlipidemia: Secondary | ICD-10-CM

## 2020-11-10 DIAGNOSIS — D179 Benign lipomatous neoplasm, unspecified: Secondary | ICD-10-CM

## 2020-11-10 DIAGNOSIS — L608 Other nail disorders: Secondary | ICD-10-CM

## 2020-11-10 DIAGNOSIS — Z125 Encounter for screening for malignant neoplasm of prostate: Secondary | ICD-10-CM

## 2020-11-10 LAB — POC URINALSYSI DIPSTICK (AUTOMATED)
Bilirubin, UA: NEGATIVE
Glucose, UA: NEGATIVE
Ketones, UA: NEGATIVE
Leukocytes, UA: NEGATIVE
Nitrite, UA: NEGATIVE
Protein, UA: NEGATIVE
Spec Grav, UA: 1.015 (ref 1.010–1.025)
Urobilinogen, UA: 0.2 E.U./dL
pH, UA: 6 (ref 5.0–8.0)

## 2020-11-10 MED ORDER — ALPRAZOLAM 0.5 MG PO TABS: 0.5000 mg | ORAL_TABLET | Freq: Two times a day (BID) | ORAL | 0 refills | Status: DC | PRN

## 2020-11-10 NOTE — Patient Instructions (Addendum)
For you wellness exam today I have ordered cbc, cmp and  lipid panel.  For frequent urination will gets psa, ua and urine culture. Might give med such as flomax after review of labs.  For gout will get uric acid. Continue allopurinol.  For lipoma made referral to general surgeon.  For anxiety while flying rx limited xanax.  Vaccine appear up to date.  BP is well controlled today. Continue losartan and metoprolol.  For blood in urine will need to follow up in 2 weeks and make sure that is clear.  For discolored nail counseled that it might be subungal hematoma from trauma vs early toe nail fungus. Educated on both.  Recommend exercise and healthy diet.  We will let you know lab results as they come in.   Follow up date appointment will be determined after lab review.     Preventive Care 51-43 Years Old, Male Preventive care refers to lifestyle choices and visits with your health care provider that can promote health and wellness. This includes:  A yearly physical exam. This is also called an annual well check.  Regular dental and eye exams.  Immunizations.  Screening for certain conditions.  Healthy lifestyle choices, such as eating a healthy diet, getting regular exercise, not using drugs or products that contain nicotine and tobacco, and limiting alcohol use. What can I expect for my preventive care visit? Physical exam Your health care provider will check:  Height and weight. These may be used to calculate body mass index (BMI), which is a measurement that tells if you are at a healthy weight.  Heart rate and blood pressure.  Your skin for abnormal spots. Counseling Your health care provider may ask you questions about:  Alcohol, tobacco, and drug use.  Emotional well-being.  Home and relationship well-being.  Sexual activity.  Eating habits.  Work and work Statistician. What immunizations do I need?  Influenza (flu) vaccine  This is recommended  every year. Tetanus, diphtheria, and pertussis (Tdap) vaccine  You may need a Td booster every 10 years. Varicella (chickenpox) vaccine  You may need this vaccine if you have not already been vaccinated. Zoster (shingles) vaccine  You may need this after age 25. Measles, mumps, and rubella (MMR) vaccine  You may need at least one dose of MMR if you were born in 1957 or later. You may also need a second dose. Pneumococcal conjugate (PCV13) vaccine  You may need this if you have certain conditions and were not previously vaccinated. Pneumococcal polysaccharide (PPSV23) vaccine  You may need one or two doses if you smoke cigarettes or if you have certain conditions. Meningococcal conjugate (MenACWY) vaccine  You may need this if you have certain conditions. Hepatitis A vaccine  You may need this if you have certain conditions or if you travel or work in places where you may be exposed to hepatitis A. Hepatitis B vaccine  You may need this if you have certain conditions or if you travel or work in places where you may be exposed to hepatitis B. Haemophilus influenzae type b (Hib) vaccine  You may need this if you have certain risk factors. Human papillomavirus (HPV) vaccine  If recommended by your health care provider, you may need three doses over 6 months. You may receive vaccines as individual doses or as more than one vaccine together in one shot (combination vaccines). Talk with your health care provider about the risks and benefits of combination vaccines. What tests do I need? Blood  tests  Lipid and cholesterol levels. These may be checked every 5 years, or more frequently if you are over 53 years old.  Hepatitis C test.  Hepatitis B test. Screening  Lung cancer screening. You may have this screening every year starting at age 14 if you have a 30-pack-year history of smoking and currently smoke or have quit within the past 15 years.  Prostate cancer screening.  Recommendations will vary depending on your family history and other risks.  Colorectal cancer screening. All adults should have this screening starting at age 67 and continuing until age 70. Your health care provider may recommend screening at age 25 if you are at increased risk. You will have tests every 1-10 years, depending on your results and the type of screening test.  Diabetes screening. This is done by checking your blood sugar (glucose) after you have not eaten for a while (fasting). You may have this done every 1-3 years.  Sexually transmitted disease (STD) testing. Follow these instructions at home: Eating and drinking  Eat a diet that includes fresh fruits and vegetables, whole grains, lean protein, and low-fat dairy products.  Take vitamin and mineral supplements as recommended by your health care provider.  Do not drink alcohol if your health care provider tells you not to drink.  If you drink alcohol: ? Limit how much you have to 0-2 drinks a day. ? Be aware of how much alcohol is in your drink. In the [place] one drink equals one 12 oz bottle of beer (355 mL), one 5 oz glass of wine (148 mL), or one 1 oz glass of hard liquor (44 mL). Lifestyle  Take daily care of your teeth and gums.  Stay active. Exercise for at least 30 minutes on 5 or more days each week.  Do not use any products that contain nicotine or tobacco, such as cigarettes, e-cigarettes, and chewing tobacco. If you need help quitting, ask your health care provider.  If you are sexually active, practice safe sex. Use a condom or other form of protection to prevent STIs (sexually transmitted infections).  Talk with your health care provider about taking a low-dose aspirin every day starting at age 69. What's next?  Go to your health care provider once a year for a well check visit.  Ask your health care provider how often you should have your eyes and teeth checked.  Stay up to date on all vaccines. This  information is not intended to replace advice given to you by your health care provider. Make sure you discuss any questions you have with your health care provider. Document Revised: 12/07/2018 Document Reviewed: 12/07/2018 Elsevier Patient Education  2020 [employer].

## 2020-11-10 NOTE — Progress Notes (Signed)
Subjective:    Patient ID: Joseph [name], male    DOB: 03-Oct-1971, 49 y.o.   MRN: [id number]  HPI  Pt in for follow up.  Pt wants annual exam today. But also had various acute concerns and chronic.  Pt has hx of htn. Pt is on toprol and losartan.  Mild elevated ldl in past.  Pt has gout. He is on allopurinol.  Pt states he had colonoscopy done in June 2021. Was told normal and repeat in 10 years. Can't find in epic.  Pt had mild elevated sugar level in past. But a1c were not elevated in diabetic range.   Pt has slight discoloration/ to medial aspect rt great toe. He is not sure if he had trauma to his toeail.  Pt states some frequent urination at times recently. He states sometimes during the day he will urinate every hour or so but not frequent at night. Sometimes he feels urge to urinate and will only give small volume. Saturday and slight burning on urination.    Left side back/lower lat area bump. IMPRESSION: Mildly hyperechoic structure in the left chest wall posteriorly likely representing a lipoma.   Review of Systems  Constitutional: Negative for chills, fatigue and fever.  Respiratory: Negative for cough, chest tightness, shortness of breath and wheezing.   Cardiovascular: Negative for chest pain and palpitations.  Gastrointestinal: Negative for abdominal pain.  Endocrine: Negative for polydipsia, polyphagia and polyuria.  Genitourinary: Positive for difficulty urinating, frequency and urgency. Negative for dysuria, penile pain and testicular pain.  Musculoskeletal: Negative for back pain.  Skin: Negative for rash.       Nail mild discolored.  Neurological: Negative for dizziness, speech difficulty and headaches.  Hematological: Negative for adenopathy. Does not bruise/bleed easily.  Psychiatric/Behavioral: Negative for behavioral problems, dysphoric mood, sleep disturbance and suicidal ideas. The patient is not nervous/anxious.      Past Medical History:    Diagnosis Date  . Back pain 03/24/2017  . 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 Glaucoma   . Gout   . History of degenerative disc disease 03/24/2017  . History of kidney stones 03/24/2017  . HTN (hypertension) 10/10/2017  . Hypertension   . Lipoma 03/24/2017  . Preventative health care 10/10/2017  . Tinnitus 03/24/2017     Social History   Socioeconomic History  . Marital status: Single    Spouse name: Not on file  . Number of children: Not on file  . Years of education: Not on file  . Highest education level: Not on file  Occupational History  . Not on file  Tobacco Use  . Smoking status: Never Smoker  . Smokeless tobacco: Never Used  Substance and Sexual Activity  . Alcohol use: Yes    Alcohol/week: 2.0 standard drinks    Types: 2 Cans of beer per week  . Drug use: No  . Sexual activity: Not on file  Other Topics Concern  . Not on file  Social History Narrative   Works as Marine scientist, no dietary restrictions.   Social Determinants of Health   Financial Resource Strain:   . Difficulty of Paying Living Expenses: Not on file  Food Insecurity:   . Worried About Charity fundraiser in the Last Year: Not on file  . Ran Out of Food in the Last Year: Not on file  Transportation Needs:   . Lack of Transportation (Medical): Not on file  . Lack of Transportation (Non-Medical): Not on file  Physical Activity:   . Days of Exercise per Week: Not on file  . Minutes of Exercise per Session: Not on file  Stress:   . Feeling of Stress : Not on file  Social Connections:   . Frequency of Communication with Friends and Family: Not on file  . Frequency of Social Gatherings with Friends and Family: Not on file  . Attends Religious Services: Not on file  . Active Member of Clubs or Organizations: Not on file  . Attends Archivist Meetings: Not on file  . Marital Status: Not on file  Intimate Partner Violence:   . Fear of Current or Ex-Partner: Not on  file  . Emotionally Abused: Not on file  . Physically Abused: Not on file  . Sexually Abused: Not on file    Past Surgical History:  Procedure Laterality Date  . APPENDECTOMY      Family History  Problem Relation Age of Onset  . Diabetes Mother   . Glaucoma Father   . Hypertension Father     No Known Allergies  Current Outpatient Medications on File Prior to Visit  Medication Sig Dispense Refill  . allopurinol (ZYLOPRIM) 100 MG tablet TAKE ONE TABLET BY MOUTH DAILY AS NEEDED  90 tablet 0  . cetirizine (ZYRTEC) 10 MG tablet Take 1 tablet (10 mg total) by mouth daily as needed for allergies. 30 tablet 5  . colchicine 0.6 MG tablet Take 2 tabs by mouth now and 1 tab by mouth in 1 hour. 3 tablet 0  . diclofenac (VOLTAREN) 75 MG EC tablet TAKE ONE TABLET BY MOUTH TWICE DAILY *used gout flare and take with food* 180 tablet 0  . fluticasone (FLONASE) 50 MCG/ACT nasal spray Place 2 sprays into both nostrils daily as needed for allergies or rhinitis. 16 g 5  . latanoprost (XALATAN) 0.005 % ophthalmic solution 1 drop at bedtime.    Marland Kitchen losartan (COZAAR) 50 MG tablet TAKE ONE TABLET BY MOUTH ONE TIME DAILY  90 tablet 0  . metoprolol succinate (TOPROL-XL) 25 MG 24 hr tablet TAKE ONE TABLET BY MOUTH ONE TIME DAILY  90 tablet 0  . dorzolamide-timolol (COSOPT) 22.3-6.8 MG/ML ophthalmic solution Place 1 drop into both eyes 2 (two) times daily.   (Patient not taking: Reported on 11/10/2020)    . meloxicam (MOBIC) 7.5 MG tablet Take 1 tablet (7.5 mg total) by mouth daily. (Patient not taking: Reported on 11/10/2020) 14 tablet 0  . NON FORMULARY [facility]  Onychomycosis Nail Lacquer -  Fluconazole 2%, Terbinafine 1% DMSO Apply to affected nail once daily Qty. 120 gm 3 refills (Patient not taking: Reported on 11/10/2020)    . tiZANidine (ZANAFLEX) 4 MG tablet Take 0.5-1 tablets (2-4 mg total) by mouth 2 (two) times daily as needed for muscle spasms. (Patient not taking: Reported on  11/10/2020) 60 tablet 2   No current facility-administered medications on file prior to visit.    BP 123/74   Pulse 78   Resp 18   Ht 5\' 9"  (1.753 m)   Wt 229 lb (103.9 kg)   SpO2 100%   BMI 33.82 kg/m    Past Medical History:  Diagnosis Date  . Back pain 03/24/2017  . 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 Glaucoma   . Gout   . History of degenerative disc disease 03/24/2017  . History of kidney stones 03/24/2017  . HTN (hypertension) 10/10/2017  . Hypertension   . Lipoma 03/24/2017  . Preventative health care 10/10/2017  .  Tinnitus 03/24/2017     Social History   Socioeconomic History  . Marital status: Single    Spouse name: Not on file  . Number of children: Not on file  . Years of education: Not on file  . Highest education level: Not on file  Occupational History  . Not on file  Tobacco Use  . Smoking status: Never Smoker  . Smokeless tobacco: Never Used  Substance and Sexual Activity  . Alcohol use: Yes    Alcohol/week: 2.0 standard drinks    Types: 2 Cans of beer per week  . Drug use: No  . Sexual activity: Not on file  Other Topics Concern  . Not on file  Social History Narrative   Works as Marine scientist, no dietary restrictions.   Social Determinants of Health   Financial Resource Strain:   . Difficulty of Paying Living Expenses: Not on file  Food Insecurity:   . Worried About Charity fundraiser in the Last Year: Not on file  . Ran Out of Food in the Last Year: Not on file  Transportation Needs:   . Lack of Transportation (Medical): Not on file  . Lack of Transportation (Non-Medical): Not on file  Physical Activity:   . Days of Exercise per Week: Not on file  . Minutes of Exercise per Session: Not on file  Stress:   . Feeling of Stress : Not on file  Social Connections:   . Frequency of Communication with Friends and Family: Not on file  . Frequency of Social Gatherings with Friends and Family: Not on file  . Attends  Religious Services: Not on file  . Active Member of Clubs or Organizations: Not on file  . Attends Archivist Meetings: Not on file  . Marital Status: Not on file  Intimate Partner Violence:   . Fear of Current or Ex-Partner: Not on file  . Emotionally Abused: Not on file  . Physically Abused: Not on file  . Sexually Abused: Not on file    Past Surgical History:  Procedure Laterality Date  . APPENDECTOMY      Family History  Problem Relation Age of Onset  . Diabetes Mother   . Glaucoma Father   . Hypertension Father     No Known Allergies  Current Outpatient Medications on File Prior to Visit  Medication Sig Dispense Refill  . allopurinol (ZYLOPRIM) 100 MG tablet TAKE ONE TABLET BY MOUTH DAILY AS NEEDED  90 tablet 0  . cetirizine (ZYRTEC) 10 MG tablet Take 1 tablet (10 mg total) by mouth daily as needed for allergies. 30 tablet 5  . colchicine 0.6 MG tablet Take 2 tabs by mouth now and 1 tab by mouth in 1 hour. 3 tablet 0  . diclofenac (VOLTAREN) 75 MG EC tablet TAKE ONE TABLET BY MOUTH TWICE DAILY *used gout flare and take with food* 180 tablet 0  . fluticasone (FLONASE) 50 MCG/ACT nasal spray Place 2 sprays into both nostrils daily as needed for allergies or rhinitis. 16 g 5  . latanoprost (XALATAN) 0.005 % ophthalmic solution 1 drop at bedtime.    Marland Kitchen losartan (COZAAR) 50 MG tablet TAKE ONE TABLET BY MOUTH ONE TIME DAILY  90 tablet 0  . metoprolol succinate (TOPROL-XL) 25 MG 24 hr tablet TAKE ONE TABLET BY MOUTH ONE TIME DAILY  90 tablet 0  . dorzolamide-timolol (COSOPT) 22.3-6.8 MG/ML ophthalmic solution Place 1 drop into both eyes 2 (two) times daily.   (  Patient not taking: Reported on 11/10/2020)    . meloxicam (MOBIC) 7.5 MG tablet Take 1 tablet (7.5 mg total) by mouth daily. (Patient not taking: Reported on 11/10/2020) 14 tablet 0  . NON FORMULARY [facility]  Onychomycosis Nail Lacquer -  Fluconazole 2%, Terbinafine 1% DMSO Apply to affected nail once  daily Qty. 120 gm 3 refills (Patient not taking: Reported on 11/10/2020)    . tiZANidine (ZANAFLEX) 4 MG tablet Take 0.5-1 tablets (2-4 mg total) by mouth 2 (two) times daily as needed for muscle spasms. (Patient not taking: Reported on 11/10/2020) 60 tablet 2   No current facility-administered medications on file prior to visit.    BP 123/74   Pulse 78   Resp 18   Ht 5\' 9"  (1.753 m)   Wt 229 lb (103.9 kg)   SpO2 100%   BMI 33.82 kg/m       Objective:   Physical Exam  General Mental Status- Alert. General Appearance- Not in acute distress.   Skin General: Color- Normal Color. Moisture- Normal Moisture.  Neck Carotid Arteries- Normal color. Moisture- Normal Moisture. No carotid bruits. No JVD.  Chest and Lung Exam Auscultation: Breath Sounds:-Normal.  Cardiovascular Auscultation:Rythm- Regular. Murmurs & Other Heart Sounds:Auscultation of the heart reveals- No Murmurs.  Abdomen Inspection:-Inspeection Normal. Palpation/Percussion:Note:No mass. Palpation and Percussion of the abdomen reveal- Non Tender, Non Distended + BS, no rebound or guarding.   Neurologic Cranial Nerve exam:- CN III-XII intact(No nystagmus), symmetric smile. Strength:- 5/5 equal and symmetric strength both upper and lower extremities.  Rectal- normal sphincter tone. Small portion of prostate palpated is normal. No lumps and symmetric. Did not feel enlarged but only small portion seen.  Left side back- moderate sized lipoma.      Assessment & Plan:  For you wellness exam today I have ordered cbc, cmp and  lipid panel.  For frequent urination will gets psa, ua and urine culture. Might give med such as flomax after review of labs.  For gout will get uric acid. Continue allopurinol.  For lipoma made referral to general surgeon.  For anxiety while flying rx limited xanax.  Vaccine appear up to date.  BP is well controlled today. Continue losartan and metoprolol.  For blood in urine  will need to follow up in 2 weeks and make sure that is clear.    For discolored nail counseled that it might be subungal hematoma from trauma vs early toe nail fungus. Educated on both.  Recommend exercise and healthy diet.  We will let you know lab results as they come in.  Follow up date appointment will be determined after lab review.   99214 charge as well in addition to wellness exam. Addressed frequent urination complaints, hematuria, anxiety, discolored nail, htn, high cholesterol and high cholesterol

## 2020-11-11 ENCOUNTER — Telehealth: Payer: Self-pay | Admitting: Medical

## 2020-11-11 LAB — COMPREHENSIVE METABOLIC PANEL
AG Ratio: 1.9 (calc) (ref 1.0–2.5)
ALT: 31 U/L (ref 9–46)
AST: 21 U/L (ref 10–35)
Albumin: 4.6 g/dL (ref 3.6–5.1)
Alkaline phosphatase (APISO): 63 U/L (ref 35–144)
BUN: 15 mg/dL (ref 7–25)
CO2: 27 mmol/L (ref 20–32)
Calcium: 9.8 mg/dL (ref 8.6–10.3)
Chloride: 101 mmol/L (ref 98–110)
Creat: 0.93 mg/dL (ref 0.70–1.33)
Globulin: 2.4 g/dL (calc) (ref 1.9–3.7)
Glucose, Bld: 91 mg/dL (ref 65–99)
Potassium: 4.8 mmol/L (ref 3.5–5.3)
Sodium: 137 mmol/L (ref 135–146)
Total Bilirubin: 0.8 mg/dL (ref 0.2–1.2)
Total Protein: 7 g/dL (ref 6.1–8.1)

## 2020-11-11 LAB — URINE CULTURE
MICRO NUMBER:: 11203339
Result:: NO GROWTH
SPECIMEN QUALITY:: ADEQUATE

## 2020-11-11 LAB — LIPID PANEL
Cholesterol: 183 mg/dL (ref <200)
HDL: 48 mg/dL (ref >=40)
LDL Cholesterol (Calc): 113 mg/dL (calc) — ABNORMAL HIGH
Non-HDL Cholesterol (Calc): 135 mg/dL (calc) — ABNORMAL HIGH (ref <130)
Total CHOL/HDL Ratio: 3.8 (calc) (ref <5.0)
Triglycerides: 110 mg/dL (ref <150)

## 2020-11-11 LAB — URIC ACID: Uric Acid, Serum: 5.7 mg/dL (ref 4.0–8.0)

## 2020-11-11 LAB — PSA: PSA: 1.74 ng/mL (ref <=4.0)

## 2020-11-11 MED ORDER — TAMSULOSIN HCL 0.4 MG PO CAPS: 0.4000 mg | ORAL_CAPSULE | Freq: Every day | ORAL | 3 refills | Status: DC

## 2020-11-11 NOTE — Telephone Encounter (Signed)
Rx flomax sent to pt pharmacy. 

## 2020-11-13 ENCOUNTER — Telehealth: Payer: Self-pay | Admitting: Family Medicine

## 2020-11-13 MED ORDER — TAMSULOSIN HCL 0.4 MG PO CAPS: 0.4000 mg | ORAL_CAPSULE | Freq: Every day | ORAL | 3 refills | Status: AC

## 2020-11-13 NOTE — Telephone Encounter (Signed)
tamsulosin (FLOMAX) 0.4 MG CAPS capsule [[id number]   Patient is requesting a 90 day supply of medication b/c [place] going [place] the [place].Patient states please send new script to [facility].

## 2020-11-13 NOTE — Telephone Encounter (Signed)
Rx sent 

## 2020-11-25 ENCOUNTER — Other Ambulatory Visit: Payer: Self-pay

## 2020-11-25 ENCOUNTER — Ambulatory Visit (INDEPENDENT_AMBULATORY_CARE_PROVIDER_SITE_OTHER): Payer: 59 | Admitting: Medical

## 2020-11-25 VITALS — BP 132/68 | HR 85 | Temp 98.2°F | Resp 18 | Ht 69.0 in | Wt 231.0 lb

## 2020-11-25 DIAGNOSIS — I10 Essential (primary) hypertension: Secondary | ICD-10-CM

## 2020-11-25 DIAGNOSIS — R1024 Suprapubic pain: Secondary | ICD-10-CM

## 2020-11-25 DIAGNOSIS — M1A9XX0 Chronic gout, unspecified, without tophus (tophi): Secondary | ICD-10-CM

## 2020-11-25 DIAGNOSIS — R319 Hematuria, unspecified: Secondary | ICD-10-CM

## 2020-11-25 DIAGNOSIS — R102 Pelvic and perineal pain: Secondary | ICD-10-CM | POA: Diagnosis not present

## 2020-11-25 DIAGNOSIS — R350 Frequency of micturition: Secondary | ICD-10-CM

## 2020-11-25 DIAGNOSIS — M79672 Pain in left foot: Secondary | ICD-10-CM

## 2020-11-25 LAB — PSA: PSA: 2.36 ng/mL (ref 0.10–4.00)

## 2020-11-25 LAB — URIC ACID: Uric Acid, Serum: 5.9 mg/dL (ref 4.0–7.8)

## 2020-11-25 MED ORDER — SULFAMETHOXAZOLE-TRIMETHOPRIM 800-160 MG PO TABS: 1.0000 | ORAL_TABLET | Freq: Two times a day (BID) | ORAL | 0 refills | Status: DC

## 2020-11-25 NOTE — Progress Notes (Signed)
Subjective:    Patient ID: Joseph [name], male    DOB: 1971/02/26, 49 y.o.   MRN: [id number]  HPI  Pt in today stating he has just recently felt urge to urinate with more intensity. Also some pain on urination. He states vague discomfort in suprapubic area. Some vague discomfort rt inguinal region as well. But today no pain. Last 2 days some increase frequency and feels like does not empty completley.  I gave pt flomax and he states did not help much.   3 years ago he had cystoscopy due to some blood in urine.  Pt has htn and his bp is well controlled.  Pt had some left foot pain on Saturday night. He thought gout flare. He stopped allopurinol and started colchicine. His foot feels better.      Review of Systems  Constitutional: Negative for chills, fatigue and fever.  HENT: Negative for congestion, drooling and ear pain.   Respiratory: Negative for cough, chest tightness, shortness of breath and wheezing.   Cardiovascular: Negative for chest pain and palpitations.  Gastrointestinal: Negative for abdominal pain, constipation, diarrhea and vomiting.  Genitourinary: Positive for frequency.       Mild dysuria.  Musculoskeletal: Negative for back pain.  Skin: Negative for rash.  Neurological: Negative for dizziness and headaches.  Hematological: Negative for adenopathy. Does not bruise/bleed easily.  Psychiatric/Behavioral: Negative for behavioral problems, confusion, sleep disturbance and suicidal ideas. The patient is not nervous/anxious.     Past Medical History:  Diagnosis Date  . Back pain 03/24/2017  . 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 Glaucoma   . Gout   . History of degenerative disc disease 03/24/2017  . History of kidney stones 03/24/2017  . HTN (hypertension) 10/10/2017  . Hypertension   . Lipoma 03/24/2017  . Preventative health care 10/10/2017  . Tinnitus 03/24/2017     Social History   Socioeconomic History  . Marital status: Single    Spouse name: Not  on file  . Number of children: Not on file  . Years of education: Not on file  . Highest education level: Not on file  Occupational History  . Not on file  Tobacco Use  . Smoking status: Never Smoker  . Smokeless tobacco: Never Used  Substance and Sexual Activity  . Alcohol use: Yes    Alcohol/week: 2.0 standard drinks    Types: 2 Cans of beer per week  . Drug use: No  . Sexual activity: Not on file  Other Topics Concern  . Not on file  Social History Narrative   Works as Marine scientist, no dietary restrictions.   Social Determinants of Health   Financial Resource Strain:   . Difficulty of Paying Living Expenses: Not on file  Food Insecurity:   . Worried About Charity fundraiser in the Last Year: Not on file  . Ran Out of Food in the Last Year: Not on file  Transportation Needs:   . Lack of Transportation (Medical): Not on file  . Lack of Transportation (Non-Medical): Not on file  Physical Activity:   . Days of Exercise per Week: Not on file  . Minutes of Exercise per Session: Not on file  Stress:   . Feeling of Stress : Not on file  Social Connections:   . Frequency of Communication with Friends and Family: Not on file  . Frequency of Social Gatherings with Friends and Family: Not on file  . Attends Religious Services: Not on  file  . Active Member of Clubs or Organizations: Not on file  . Attends Archivist Meetings: Not on file  . Marital Status: Not on file  Intimate Partner Violence:   . Fear of Current or Ex-Partner: Not on file  . Emotionally Abused: Not on file  . Physically Abused: Not on file  . Sexually Abused: Not on file    Past Surgical History:  Procedure Laterality Date  . APPENDECTOMY      Family History  Problem Relation Age of Onset  . Diabetes Mother   . Glaucoma Father   . Hypertension Father     No Known Allergies  Current Outpatient Medications on File Prior to Visit  Medication Sig Dispense Refill  .  allopurinol (ZYLOPRIM) 100 MG tablet TAKE ONE TABLET BY MOUTH DAILY AS NEEDED  90 tablet 0  . ALPRAZolam (XANAX) 0.5 MG tablet Take 1 tablet (0.5 mg total) by mouth 2 (two) times daily as needed for anxiety. 6 tablet 0  . diclofenac (VOLTAREN) 75 MG EC tablet TAKE ONE TABLET BY MOUTH TWICE DAILY *used gout flare and take with food* 180 tablet 0  . latanoprost (XALATAN) 0.005 % ophthalmic solution 1 drop at bedtime.    Marland Kitchen losartan (COZAAR) 50 MG tablet TAKE ONE TABLET BY MOUTH ONE TIME DAILY  90 tablet 0  . metoprolol succinate (TOPROL-XL) 25 MG 24 hr tablet TAKE ONE TABLET BY MOUTH ONE TIME DAILY  90 tablet 0  . tamsulosin (FLOMAX) 0.4 MG CAPS capsule Take 1 capsule (0.4 mg total) by mouth daily. 90 capsule 3  . cetirizine (ZYRTEC) 10 MG tablet Take 1 tablet (10 mg total) by mouth daily as needed for allergies. 30 tablet 5  . colchicine 0.6 MG tablet Take 2 tabs by mouth now and 1 tab by mouth in 1 hour. 3 tablet 0  . dorzolamide-timolol (COSOPT) 22.3-6.8 MG/ML ophthalmic solution Place 1 drop into both eyes 2 (two) times daily.   (Patient not taking: Reported on 11/10/2020)    . fluticasone (FLONASE) 50 MCG/ACT nasal spray Place 2 sprays into both nostrils daily as needed for allergies or rhinitis. 16 g 5  . meloxicam (MOBIC) 7.5 MG tablet Take 1 tablet (7.5 mg total) by mouth daily. (Patient not taking: Reported on 11/10/2020) 14 tablet 0  . NON FORMULARY [facility]  Onychomycosis Nail Lacquer -  Fluconazole 2%, Terbinafine 1% DMSO Apply to affected nail once daily Qty. 120 gm 3 refills (Patient not taking: Reported on 11/10/2020)    . tiZANidine (ZANAFLEX) 4 MG tablet Take 0.5-1 tablets (2-4 mg total) by mouth 2 (two) times daily as needed for muscle spasms. (Patient not taking: Reported on 11/10/2020) 60 tablet 2   No current facility-administered medications on file prior to visit.    BP 132/68   Pulse 85   Temp 98.2 F (36.8 C) (Oral)   Resp 18   Ht 5\' 9"  (1.753 m)   Wt 231  lb (104.8 kg)   SpO2 99%   BMI 34.11 kg/m      Objective:   Physical Exam  General Mental Status- Alert. General Appearance- Not in acute distress.   Skin General: Color- Normal Color. Moisture- Normal Moisture.  Neck Carotid Arteries- Normal color. Moisture- Normal Moisture. No carotid bruits. No JVD.  Chest and Lung Exam Auscultation: Breath Sounds:-Normal.  Cardiovascular Auscultation:Rythm- Regular. Murmurs & Other Heart Sounds:Auscultation of the heart reveals- No Murmurs.  Abdomen Inspection:-Inspeection Normal. Palpation/Percussion:Note:No mass. Palpation and Percussion of the abdomen  reveal- Non Tender, Non Distended + BS, no rebound or guarding.   Neurologic Cranial Nerve exam:- CN III-XII intact(No nystagmus), symmetric smile. Strength:- 5/5 equal and symmetric strength both upper and lower extremities.  Genital- no testicle tenderness. No hernia on exam. Particularly rt inguinal area.  Back- no cva tenderness.  Left foot- no redness, no swelling or tenderness presently.    Assessment & Plan:  BP well controlled. Continue losartan and metoprolol.   Frequent urination with dyausia and sensation not emptying out bladder. Also recent hematuria. Prostatitis vs bph. Will rx bactrim antibiotic get psa, ua, and urine culture. May need to refer you to urologist.   For left foot pain(hx of gout) get uric acid and continue colchicine.  Follow up in 7-10 days or as needed.   Mackie Pai, PA-C

## 2020-11-25 NOTE — Patient Instructions (Addendum)
BP well controlled. Continue losartan and metoprolol.   Frequent urination with dyausia and sensation not emptying out bladder. Also recent hematuria. Prostatitis vs bph. Will rx bactrim antibiotic get psa, ua, and urine culture. May need to refer you to urologist.   For left foot pain(hx of gout) get uric acid and continue colchicine.  Follow up in 7-10 days or as needed.

## 2020-11-26 ENCOUNTER — Telehealth: Payer: Self-pay | Admitting: Medical

## 2020-11-26 DIAGNOSIS — R350 Frequency of micturition: Secondary | ICD-10-CM

## 2020-11-26 DIAGNOSIS — R319 Hematuria, unspecified: Secondary | ICD-10-CM

## 2020-11-26 DIAGNOSIS — R102 Pelvic and perineal pain: Secondary | ICD-10-CM

## 2020-11-26 LAB — URINE CULTURE
MICRO NUMBER:: 11257307
Result:: NO GROWTH
SPECIMEN QUALITY:: ADEQUATE

## 2020-11-26 NOTE — Telephone Encounter (Signed)
Kim wanted to know if you would like for [place] to have patient to come back in for urine dip?

## 2020-11-26 NOTE — Telephone Encounter (Signed)
I ordered ua poct on this pt. Looks like was never done. I notified hannah. She sent urine for culture but poct ua not done.

## 2020-11-26 NOTE — Telephone Encounter (Signed)
If he can come back to give urine. Want to know if blood present. He has hx of some hematuria in past.

## 2020-11-26 NOTE — Telephone Encounter (Signed)
Patient will be by tomorrow to give another urine.

## 2020-11-27 ENCOUNTER — Telehealth: Payer: Self-pay | Admitting: Medical

## 2020-11-27 ENCOUNTER — Other Ambulatory Visit (INDEPENDENT_AMBULATORY_CARE_PROVIDER_SITE_OTHER): Payer: 59

## 2020-11-27 ENCOUNTER — Other Ambulatory Visit: Payer: Self-pay

## 2020-11-27 DIAGNOSIS — R319 Hematuria, unspecified: Secondary | ICD-10-CM

## 2020-11-27 DIAGNOSIS — R102 Pelvic and perineal pain: Secondary | ICD-10-CM

## 2020-11-27 DIAGNOSIS — N401 Enlarged prostate with lower urinary tract symptoms: Secondary | ICD-10-CM

## 2020-11-27 DIAGNOSIS — R350 Frequency of micturition: Secondary | ICD-10-CM | POA: Diagnosis not present

## 2020-11-27 DIAGNOSIS — R3915 Urgency of urination: Secondary | ICD-10-CM

## 2020-11-27 LAB — POC URINALSYSI DIPSTICK (AUTOMATED)
Bilirubin, UA: NEGATIVE
Blood, UA: NEGATIVE
Glucose, UA: NEGATIVE
Ketones, UA: NEGATIVE
Leukocytes, UA: NEGATIVE
Nitrite, UA: NEGATIVE
Protein, UA: NEGATIVE
Spec Grav, UA: 1.01 (ref 1.010–1.025)
Urobilinogen, UA: 0.2 E.U./dL
pH, UA: 6 (ref 5.0–8.0)

## 2020-11-27 NOTE — Telephone Encounter (Signed)
Will you refer pt to urologist on [place].

## 2020-11-28 NOTE — Telephone Encounter (Signed)
Done

## 2021-02-14 ENCOUNTER — Encounter: Payer: Self-pay | Admitting: Medical

## 2021-02-16 ENCOUNTER — Telehealth: Payer: Self-pay | Admitting: Medical

## 2021-02-16 MED ORDER — TAMSULOSIN HCL 0.4 MG PO CAPS: 0.4000 mg | ORAL_CAPSULE | Freq: Every day | ORAL | 3 refills | Status: DC

## 2021-02-16 NOTE — Telephone Encounter (Signed)
Rx flomax sent to pt pharmacy. 

## 2021-02-21 ENCOUNTER — Other Ambulatory Visit: Payer: Self-pay | Admitting: Family Medicine

## 2021-02-21 DIAGNOSIS — M1A0790 Idiopathic chronic gout, unspecified ankle and foot, without tophus (tophi): Secondary | ICD-10-CM

## 2021-02-24 ENCOUNTER — Telehealth: Payer: Self-pay | Admitting: *Deleted

## 2021-02-24 MED ORDER — ALLOPURINOL 100 MG PO TABS: ORAL_TABLET | ORAL | 0 refills | Status: DC

## 2021-02-24 MED ORDER — METOPROLOL SUCCINATE ER 25 MG PO TB24: 25.0000 mg | ORAL_TABLET | Freq: Every day | ORAL | 0 refills | Status: DC

## 2021-02-24 MED ORDER — LOSARTAN POTASSIUM 50 MG PO TABS: 50.0000 mg | ORAL_TABLET | Freq: Every day | ORAL | 0 refills | Status: DC

## 2021-02-24 NOTE — Telephone Encounter (Signed)
Patient stated that he is now taking two tablet daily.  This was increased by Dr. Thornton Dales.  This is from her note from 07/2020: Gout: Uncontrolled. Emphasis on weight loss Allopurinol : increase to 200 mg /day

## 2021-02-24 NOTE — Telephone Encounter (Signed)
Ok. To transfer. If Dr. Charlett Blake agrees

## 2021-02-24 NOTE — Telephone Encounter (Signed)
OK with me.

## 2021-02-24 NOTE — Telephone Encounter (Signed)
This patient seen Charlett Blake in the past and left due to insurance reasons and had to go to [place] another new insurance and he saw Percell Miller and would like for Percell Miller to be PCP.  He last saw Percell Miller in November 2021 for cpe and follow up.  Percell Miller will you take on this patient please?

## 2021-02-25 NOTE — Telephone Encounter (Signed)
Pcp change.

## 2021-05-19 ENCOUNTER — Telehealth: Payer: Self-pay | Admitting: Medical

## 2021-05-19 DIAGNOSIS — M1A0790 Idiopathic chronic gout, unspecified ankle and foot, without tophus (tophi): Secondary | ICD-10-CM

## 2021-05-19 MED ORDER — LOSARTAN POTASSIUM 50 MG PO TABS: 50.0000 mg | ORAL_TABLET | Freq: Every day | ORAL | 0 refills | Status: DC

## 2021-05-19 MED ORDER — ALLOPURINOL 100 MG PO TABS: ORAL_TABLET | ORAL | 0 refills | Status: DC

## 2021-05-19 MED ORDER — METOPROLOL SUCCINATE ER 25 MG PO TB24: 25.0000 mg | ORAL_TABLET | Freq: Every day | ORAL | 0 refills | Status: DC

## 2021-05-19 NOTE — Telephone Encounter (Signed)
Medication: losartan (COZAAR) 50 MG tablet [[id number]  metoprolol succinate (TOPROL-XL) 25 MG 24 hr tablet [[id number]]   allopurinol (ZYLOPRIM) 100 MG tablet [[id number]]      Has the patient contacted their pharmacy? no (If no, request that the patient contact the pharmacy for the refill.) (If yes, when and what did the pharmacy advise?)    Preferred Pharmacy (with phone number or street name): [facility] [place], [place]  [address] [place] [address]  Phone:  [phone] Fax:  [phone]     Agent: Please be advised that RX refills may take up to 3 business days. We ask that you follow-up with your pharmacy.

## 2021-05-19 NOTE — Telephone Encounter (Signed)
Rx sent 

## 2021-06-22 ENCOUNTER — Other Ambulatory Visit: Payer: Self-pay | Admitting: Urology

## 2021-06-24 ENCOUNTER — Other Ambulatory Visit: Payer: Self-pay

## 2021-06-24 ENCOUNTER — Encounter (HOSPITAL_BASED_OUTPATIENT_CLINIC_OR_DEPARTMENT_OTHER): Payer: Self-pay | Admitting: Urology

## 2021-06-24 DIAGNOSIS — Z973 Presence of spectacles and contact lenses: Secondary | ICD-10-CM | POA: Insufficient documentation

## 2021-06-24 DIAGNOSIS — H409 Unspecified glaucoma: Secondary | ICD-10-CM

## 2021-06-24 HISTORY — DX: Unspecified glaucoma: H40.9

## 2021-06-24 HISTORY — DX: Presence of spectacles and contact lenses: Z97.3

## 2021-06-24 NOTE — Progress Notes (Signed)
Spoke w/ via phone for pre-op interview---PT Lab needs dos----  Iistat, ekg             Lab results------none COVID test -----patient states asymptomatic no test needed Arrive at -------715 am 06-26-2021 NPO after MN NO Solid Food.  Clear liquids from MN until---615 am then npo Med rec completed Medications to take morning of surgery -----allopurinol, metoprolol succinate Diabetic medication -----n/a Patient instructed no nail polish to be worn day of surgery Patient instructed to bring photo id and insurance card day of surgery Patient aware to have Driver (ride ) / caregiver  exwife deidra   for 24 hours after surgery  Patient Special Instructions -----none Pre-Op special Istructions -----none Patient verbalized understanding of instructions that were given at this phone interview. Patient denies shortness of breath, chest pain, fever, cough at this phone interview.

## 2021-06-26 ENCOUNTER — Encounter (HOSPITAL_BASED_OUTPATIENT_CLINIC_OR_DEPARTMENT_OTHER)
Admission: RE | Disposition: A | Discharge status: Home / Self Care | Payer: Self-pay | Source: Home / Self Care | Attending: Urology

## 2021-06-26 ENCOUNTER — Ambulatory Visit (HOSPITAL_BASED_OUTPATIENT_CLINIC_OR_DEPARTMENT_OTHER)
Admission: RE | Admit: 2021-06-26 | Discharge: 2021-06-26 | Disposition: A | Discharge status: Home / Self Care | Payer: Commercial Managed Care - PPO | Attending: Urology | Admitting: Urology

## 2021-06-26 ENCOUNTER — Encounter (HOSPITAL_BASED_OUTPATIENT_CLINIC_OR_DEPARTMENT_OTHER): Payer: Self-pay | Admitting: Urology

## 2021-06-26 ENCOUNTER — Ambulatory Visit (HOSPITAL_BASED_OUTPATIENT_CLINIC_OR_DEPARTMENT_OTHER): Payer: Commercial Managed Care - PPO | Admitting: Anesthesiology

## 2021-06-26 DIAGNOSIS — N201 Calculus of ureter: Secondary | ICD-10-CM | POA: Diagnosis not present

## 2021-06-26 DIAGNOSIS — Z87442 Personal history of urinary calculi: Secondary | ICD-10-CM | POA: Insufficient documentation

## 2021-06-26 HISTORY — PX: CYSTOSCOPY WITH RETROGRADE PYELOGRAM, URETEROSCOPY AND STENT PLACEMENT: SHX5789

## 2021-06-26 LAB — POCT I-STAT, CHEM 8
BUN: 12 mg/dL (ref 6–20)
Calcium, Ion: 1.29 mmol/L (ref 1.15–1.40)
Chloride: 103 mmol/L (ref 98–111)
Creatinine, Ser: 1 mg/dL (ref 0.61–1.24)
Glucose, Bld: 105 mg/dL — ABNORMAL HIGH (ref 70–99)
HCT: 49 % (ref 39.0–52.0)
Hemoglobin: 16.7 g/dL (ref 13.0–17.0)
Potassium: 4.3 mmol/L (ref 3.5–5.1)
Sodium: 141 mmol/L (ref 135–145)
TCO2: 25 mmol/L (ref 22–32)

## 2021-06-26 SURGERY — CYSTOURETEROSCOPY, WITH RETROGRADE PYELOGRAM AND STENT INSERTION
Anesthesia: General | Site: Ureter | Laterality: Right

## 2021-06-26 MED ORDER — KETOROLAC TROMETHAMINE 30 MG/ML IJ SOLN
INTRAMUSCULAR | Status: AC
  Filled 2021-06-26: qty 1

## 2021-06-26 MED ORDER — PROPOFOL 10 MG/ML IV BOLUS
INTRAVENOUS | Status: DC | PRN
  Administered 2021-06-26 (×2): 200 mg via INTRAVENOUS

## 2021-06-26 MED ORDER — ACETAMINOPHEN 325 MG RE SUPP: 650.0000 mg | RECTAL | Status: DC | PRN

## 2021-06-26 MED ORDER — PHENAZOPYRIDINE HCL 100 MG PO TABS
200.0000 mg | ORAL_TABLET | Freq: Once | ORAL | Status: AC
  Administered 2021-06-26: 200 mg via ORAL

## 2021-06-26 MED ORDER — CEFAZOLIN SODIUM-DEXTROSE 2-4 GM/100ML-% IV SOLN
2.0000 g | INTRAVENOUS | Status: AC
  Administered 2021-06-26: 2 g via INTRAVENOUS

## 2021-06-26 MED ORDER — FENTANYL CITRATE (PF) 100 MCG/2ML IJ SOLN
INTRAMUSCULAR | Status: AC
  Filled 2021-06-26: qty 2

## 2021-06-26 MED ORDER — FENTANYL CITRATE (PF) 100 MCG/2ML IJ SOLN: 25.0000 ug | INTRAMUSCULAR | Status: DC | PRN

## 2021-06-26 MED ORDER — OXYCODONE HCL 5 MG PO TABS
5.0000 mg | ORAL_TABLET | Freq: Once | ORAL | Status: AC | PRN
  Administered 2021-06-26: 5 mg via ORAL

## 2021-06-26 MED ORDER — PHENAZOPYRIDINE HCL 200 MG PO TABS: 200.0000 mg | ORAL_TABLET | Freq: Three times a day (TID) | ORAL | 1 refills | Status: DC | PRN

## 2021-06-26 MED ORDER — ACETAMINOPHEN 500 MG PO TABS
1000.0000 mg | ORAL_TABLET | Freq: Once | ORAL | Status: AC
  Administered 2021-06-26: 1000 mg via ORAL

## 2021-06-26 MED ORDER — PHENAZOPYRIDINE HCL 100 MG PO TABS
ORAL_TABLET | ORAL | Status: AC
  Filled 2021-06-26: qty 2

## 2021-06-26 MED ORDER — SODIUM CHLORIDE 0.9 % IV SOLN: 250.0000 mL | INTRAVENOUS | Status: DC | PRN

## 2021-06-26 MED ORDER — OXYCODONE HCL 5 MG/5ML PO SOLN
5.0000 mg | Freq: Once | ORAL | Status: AC | PRN
Start: 2021-06-26 — End: 2021-06-26

## 2021-06-26 MED ORDER — MORPHINE SULFATE (PF) 4 MG/ML IV SOLN: 2.0000 mg | INTRAVENOUS | Status: DC | PRN

## 2021-06-26 MED ORDER — FENTANYL CITRATE (PF) 100 MCG/2ML IJ SOLN
INTRAMUSCULAR | Status: DC | PRN
  Administered 2021-06-26 (×2): 50 ug via INTRAVENOUS

## 2021-06-26 MED ORDER — ACETAMINOPHEN 500 MG PO TABS
ORAL_TABLET | ORAL | Status: AC
  Filled 2021-06-26: qty 2

## 2021-06-26 MED ORDER — ONDANSETRON HCL 4 MG/2ML IJ SOLN
INTRAMUSCULAR | Status: AC
  Filled 2021-06-26: qty 2

## 2021-06-26 MED ORDER — SODIUM CHLORIDE 0.9% FLUSH: 3.0000 mL | INTRAVENOUS | Status: DC | PRN

## 2021-06-26 MED ORDER — SODIUM CHLORIDE 0.9 % IR SOLN
Status: DC | PRN
  Administered 2021-06-26: 3000 mL via INTRAVESICAL

## 2021-06-26 MED ORDER — PROMETHAZINE HCL 25 MG/ML IJ SOLN: 6.2500 mg | INTRAMUSCULAR | Status: DC | PRN

## 2021-06-26 MED ORDER — ACETAMINOPHEN 325 MG PO TABS: 650.0000 mg | ORAL_TABLET | ORAL | Status: DC | PRN

## 2021-06-26 MED ORDER — KETOROLAC TROMETHAMINE 30 MG/ML IJ SOLN
INTRAMUSCULAR | Status: DC | PRN
  Administered 2021-06-26: 30 mg via INTRAVENOUS

## 2021-06-26 MED ORDER — PROPOFOL 10 MG/ML IV BOLUS
INTRAVENOUS | Status: AC
  Filled 2021-06-26: qty 20

## 2021-06-26 MED ORDER — DEXAMETHASONE SODIUM PHOSPHATE 4 MG/ML IJ SOLN
INTRAMUSCULAR | Status: DC | PRN
  Administered 2021-06-26: 5 mg via INTRAVENOUS

## 2021-06-26 MED ORDER — OXYCODONE HCL 5 MG PO TABS
ORAL_TABLET | ORAL | Status: AC
  Filled 2021-06-26: qty 1

## 2021-06-26 MED ORDER — HYDROCODONE-ACETAMINOPHEN 5-325 MG PO TABS: 1.0000 | ORAL_TABLET | Freq: Four times a day (QID) | ORAL | 0 refills | Status: DC | PRN

## 2021-06-26 MED ORDER — OXYCODONE HCL 5 MG PO TABS: 5.0000 mg | ORAL_TABLET | ORAL | Status: DC | PRN

## 2021-06-26 MED ORDER — IOHEXOL 300 MG/ML  SOLN
INTRAMUSCULAR | Status: DC | PRN
  Administered 2021-06-26: 5 mL via URETHRAL

## 2021-06-26 MED ORDER — MIDAZOLAM HCL 2 MG/2ML IJ SOLN
INTRAMUSCULAR | Status: AC
  Filled 2021-06-26: qty 2

## 2021-06-26 MED ORDER — CEFAZOLIN SODIUM-DEXTROSE 2-4 GM/100ML-% IV SOLN
INTRAVENOUS | Status: AC
  Filled 2021-06-26: qty 100

## 2021-06-26 MED ORDER — DEXAMETHASONE SODIUM PHOSPHATE 10 MG/ML IJ SOLN
INTRAMUSCULAR | Status: AC
  Filled 2021-06-26: qty 1

## 2021-06-26 MED ORDER — SODIUM CHLORIDE 0.9% FLUSH: 3.0000 mL | Freq: Two times a day (BID) | INTRAVENOUS | Status: DC

## 2021-06-26 MED ORDER — MIDAZOLAM HCL 5 MG/5ML IJ SOLN
INTRAMUSCULAR | Status: DC | PRN
  Administered 2021-06-26 (×2): 1 mg via INTRAVENOUS

## 2021-06-26 MED ORDER — LACTATED RINGERS IV SOLN: INTRAVENOUS | Status: DC

## 2021-06-26 MED ORDER — ONDANSETRON HCL 4 MG/2ML IJ SOLN
INTRAMUSCULAR | Status: DC | PRN
  Administered 2021-06-26: 4 mg via INTRAVENOUS

## 2021-06-26 SURGICAL SUPPLY — 31 items
BAG DRAIN URO-CYSTO SKYTR STRL (DRAIN) ×2 IMPLANT
BAG DRN UROCATH (DRAIN) ×1
BASKET STONE 1.7 NGAGE (UROLOGICAL SUPPLIES) ×2 IMPLANT
BASKET ZERO TIP NITINOL 2.4FR (BASKET) IMPLANT
BSKT STON RTRVL ZERO TP 2.4FR (BASKET)
CATH URET 5FR 28IN CONE TIP (BALLOONS)
CATH URET 5FR 28IN OPEN ENDED (CATHETERS) ×2 IMPLANT
CATH URET 5FR 70CM CONE TIP (BALLOONS) IMPLANT
CLOTH BEACON ORANGE TIMEOUT ST (SAFETY) ×2 IMPLANT
ELECT REM PT RETURN 9FT ADLT (ELECTROSURGICAL)
ELECTRODE REM PT RTRN 9FT ADLT (ELECTROSURGICAL) IMPLANT
FIBER LASER FLEXIVA 365 (UROLOGICAL SUPPLIES) IMPLANT
GLOVE SURG ENC MOIS LTX SZ7 (GLOVE) ×4 IMPLANT
GLOVE SURG POLYISO LF SZ8 (GLOVE) ×2 IMPLANT
GLOVE SURG UNDER POLY LF SZ7 (GLOVE) ×2 IMPLANT
GOWN STRL REUS W/TWL XL LVL3 (GOWN DISPOSABLE) ×4 IMPLANT
GUIDEWIRE ANG ZIPWIRE 038X150 (WIRE) IMPLANT
GUIDEWIRE STR DUAL SENSOR (WIRE) ×2 IMPLANT
INFUSOR MANOMETER BAG 3000ML (MISCELLANEOUS) IMPLANT
IV NS IRRIG 3000ML ARTHROMATIC (IV SOLUTION) ×2 IMPLANT
KIT BALLN UROMAX 15FX4 (MISCELLANEOUS) ×1 IMPLANT
KIT BALLN UROMAX 26 75X4 (MISCELLANEOUS) ×1
KIT TURNOVER CYSTO (KITS) ×2 IMPLANT
MANIFOLD NEPTUNE II (INSTRUMENTS) ×2 IMPLANT
NS IRRIG 500ML POUR BTL (IV SOLUTION) IMPLANT
PACK CYSTO (CUSTOM PROCEDURE TRAY) ×2 IMPLANT
STENT URET 6FRX24 CONTOUR (STENTS) ×2 IMPLANT
TRACTIP FLEXIVA PULS ID 200XHI (Laser) IMPLANT
TRACTIP FLEXIVA PULSE ID 200 (Laser)
TUBE CONNECTING 12X1/4 (SUCTIONS) ×2 IMPLANT
TUBING UROLOGY SET (TUBING) ×2 IMPLANT

## 2021-06-26 NOTE — Discharge Instructions (Addendum)
You may removed the stent by pulling the attached string on Monday morning.  If you don't feel you can do that, please contact the office to come in Tuesday for removal.   Please bring your stones for analysis at your f/u visit or on your way home today.    Post Anesthesia Home Care Instructions  Activity: Get plenty of rest for the remainder of the day. A responsible individual must stay with you for 24 hours following the procedure.  For the next 24 hours, DO NOT: -Drive a car -Paediatric nurse -Drink alcoholic beverages -Take any medication unless instructed by your physician -Make any legal decisions or sign important papers.  Meals: Start with liquid foods such as gelatin or soup. Progress to regular foods as tolerated. Avoid greasy, spicy, heavy foods. If nausea and/or vomiting occur, drink only clear liquids until the nausea and/or vomiting subsides. Call your physician if vomiting continues.  Special Instructions/Symptoms: Your throat may feel dry or sore from the anesthesia or the breathing tube placed in your throat during surgery. If this causes discomfort, gargle with warm salt water. The discomfort should disappear within 24 hours.      No ibuprofen, Advil, Aleve, Motrin, or naproxen until after 4 pm today if needed.     No Tylenol/acetaminophen until after 2 pm today if needed.

## 2021-06-26 NOTE — H&P (Signed)
Subjective: Right distal ureteral stone.  Joseph [name] is a 50 yo male who presented to the office with a 6 month history of irritative voiding symptoms.   He had a KUB and CT that showed a non-obstructing 21mm right distal ureteral stone that is the likely source of his symptoms.  The stone was previously in the RLP.  He has no assoicated signs or symptoms. .     ROS:  Review of Systems  Genitourinary:  Positive for frequency and urgency.  All other systems reviewed and are negative.  No Known Allergies  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   Glaucoma 06/24/2021   BOTH EYES   Gout    NO RECENT FLARES AS OF 06-25-2021   History of degenerative disc disease 03/24/2017   History of kidney stones 03/24/2017   HTN (hypertension) 10/10/2017   Lipoma 03/24/2017   Tinnitus 03/24/2017   Wears glasses 06/24/2021    Past Surgical History:  Procedure Laterality Date   APPENDECTOMY     AGE 33    Social History   Socioeconomic History   Marital status: Single    Spouse name: Not on file   Number of children: Not on file   Years of education: Not on file   Highest education level: Not on file  Occupational History   Not on file  Tobacco Use   Smoking status: Never   Smokeless tobacco: Never  Vaping Use   Vaping Use: Never used  Substance and Sexual Activity   Alcohol use: Yes    Alcohol/week: 2.0 standard drinks    Types: 2 Cans of beer per week    Comment: [place]   Drug use: No   Sexual activity: Not on file  Other Topics Concern   Not on file  Social History Narrative   Works as Marine scientist, no dietary restrictions.   Social Determinants of Health   Financial Resource Strain: Not on file  Food Insecurity: Not on file  Transportation Needs: Not on file  Physical Activity: Not on file  Stress: Not on file  Social Connections: Not on file  Intimate Partner Violence: Not on file     Family History  Problem Relation Age of Onset   Diabetes Mother    Glaucoma Father    Hypertension Father     Anti-infectives: Anti-infectives (From admission, onward)    Start     Dose/Rate Route Frequency Ordered Stop   06/26/21 0729  ceFAZolin (ANCEF) IVPB 2g/100 mL premix        2 g 200 mL/hr over 30 Minutes Intravenous 30 min pre-op 06/26/21 7096         Current Facility-Administered Medications  Medication Dose Route Frequency Provider Last Rate Last Admin   ceFAZolin (ANCEF) IVPB 2g/100 mL premix  2 g Intravenous 30 min Pre-Op Irine Seal, MD       lactated ringers infusion   Intravenous Continuous Albertha Ghee, MD 50 mL/hr at 06/26/21 0759 New Bag at 06/26/21 0759     Objective: Vital signs in last 24 hours: BP 120/74   Pulse 72   Temp 97.6 F (36.4 C) (Oral)   Resp 14   Ht 5\' 8"  (1.727 m)   Wt 100.1 kg   SpO2 100%   BMI 33.54 kg/m   Intake/Output from previous day: No intake/output data recorded. Intake/Output this shift: No intake/output data recorded.   Physical Exam  Vitals reviewed.  Constitutional:      Appearance: Normal appearance.  Cardiovascular:     Rate and Rhythm: Normal rate and regular rhythm.     Heart sounds: Normal heart sounds.  Pulmonary:     Effort: Pulmonary effort is normal. No respiratory distress.     Breath sounds: Normal breath sounds.  Abdominal:     General: Abdomen is flat.     Palpations: Abdomen is soft.     Tenderness: There is no abdominal tenderness.  Skin:    General: Skin is warm and dry.  Neurological:     General: No focal deficit present.     Mental Status: He is alert and oriented to person, place, and time.  Psychiatric:        Mood and Affect: Mood normal.        Behavior: Behavior normal.    Lab Results:  UA clear.  BMET No results for input(s): NA, K, CL, CO2, GLUCOSE, BUN, CREATININE, CALCIUM in the last 72 hours. PT/INR No results for input(s): LABPROT, INR in the last 72  hours. ABG No results for input(s): PHART, HCO3 in the last 72 hours.  Invalid input(s): PCO2, PO2  Studies/Results: KUB and CT results noted above.    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  He has agreed to ureteroscopic stone extraction.  Informed consent obtained.        Irine Seal 06/26/2021 [phone]

## 2021-06-26 NOTE — Anesthesia Postprocedure Evaluation (Signed)
Anesthesia Post Note  Patient: Joseph [name]  Procedure(s) Performed: CYSTOSCOPY WITH RIGHT RETROGRADE / RIGHT URETERAL DILATION/ URETEROSCOPY HOLMIUM LASER AND STENT PLACEMENT (Right: Ureter)     Patient location during evaluation: PACU Anesthesia Type: General Level of consciousness: awake Pain management: pain level controlled Vital Signs Assessment: post-procedure vital signs reviewed and stable Respiratory status: spontaneous breathing and respiratory function stable Cardiovascular status: stable Postop Assessment: no apparent nausea or vomiting Anesthetic complications: no   No notable events documented.  Last Vitals:  Vitals:   06/26/21 1030 06/26/21 1132  BP: 126/77 (!) 101/54  Pulse: 69 (!) 57  Resp: 12 16  Temp:  36.5 C  SpO2: 97% 100%    Last Pain:  Vitals:   06/26/21 1150  TempSrc:   PainSc: 5                  Candra R Brenon Antosh

## 2021-06-26 NOTE — Anesthesia Preprocedure Evaluation (Addendum)
Anesthesia Evaluation  Patient identified by MRN, date of birth, ID band Patient awake    Reviewed: Allergy & Precautions, NPO status , Patient's Chart, lab work & pertinent test results  Airway Mallampati: II  TM Distance: >3 FB Neck ROM: Full    Dental no notable dental hx.    Pulmonary neg pulmonary ROS,    Pulmonary exam normal breath sounds clear to auscultation       Cardiovascular Exercise Tolerance: Good hypertension, Normal cardiovascular exam Rhythm:Regular Rate:Normal     Neuro/Psych Anxiety tinnitus    GI/Hepatic negative GI ROS, Neg liver ROS,   Endo/Other  Obesity BMI 34  Renal/GU Renal disease (kidney stones)  negative genitourinary   Musculoskeletal  (+) Arthritis , Osteoarthritis,    Abdominal   Peds  Hematology negative hematology ROS (+)   Anesthesia Other Findings glaucoma  Reproductive/Obstetrics                            Anesthesia Physical Anesthesia Plan  ASA: 2  Anesthesia Plan: General   Post-op Pain Management:    Induction: Intravenous  PONV Risk Score and Plan: 2  Airway Management Planned: LMA  Additional Equipment: None  Intra-op Plan:   Post-operative Plan: Extubation in OR  Informed Consent: I have reviewed the patients History and Physical, chart, labs and discussed the procedure including the risks, benefits and alternatives for the proposed anesthesia with the patient or authorized representative who has indicated his/her understanding and acceptance.     Dental advisory given  Plan Discussed with: CRNA, Anesthesiologist and Surgeon  Anesthesia Plan Comments:        Anesthesia Quick Evaluation

## 2021-06-26 NOTE — Transfer of Care (Signed)
Immediate Anesthesia Transfer of Care Note  Patient: Moses Manners  Procedure(s) Performed: CYSTOSCOPY WITH RIGHT RETROGRADE / RIGHT URETERAL DILATION/ URETEROSCOPY HOLMIUM LASER AND STENT PLACEMENT (Right: Ureter)  Patient Location: PACU  Anesthesia Type:General  Level of Consciousness: awake, alert  and oriented  Airway & Oxygen Therapy: Patient Spontanous Breathing and Patient connected to nasal cannula oxygen  Post-op Assessment: Report given to RN and Post -op Vital signs reviewed and stable  Post vital signs: Reviewed and stable  Last Vitals:  Vitals Value Taken Time  BP 138/78 06/26/21 1002  Temp    Pulse 92 06/26/21 1004  Resp 11 06/26/21 1004  SpO2 97 % 06/26/21 1004  Vitals shown include unvalidated device data.  Last Pain:  Vitals:   06/26/21 0752  TempSrc: Oral  PainSc: 0-No pain      Patients Stated Pain Goal: 3 (47/65/46 5035)  Complications: No notable events documented.

## 2021-06-26 NOTE — Op Note (Signed)
Procedure: 1.  Cystoscopy with right retrograde pyelogram and interpretation. 2.  Right ureteroscopic stone extraction with holmium laser application and insertion of right double-J stent. 3.  Application of fluoroscopy less than 1 hour.  Preop diagnosis: 4 mm right distal ureteral stone.  Postop diagnosis: Same.  Surgeon: Dr. Irine Seal.  Anesthesia: General.  Specimen: Stone fragments.  Drain: Right 6 [place] by 24 cm contour double-J stent with tether.  EBL: None.  Complications: None.  Indications: The patient is a 50 year old male with a history of stones who presented the office with a 75-monthhistory of irritative voiding symptoms.  A KUB demonstrated calcification right pelvis and that was confirmed to be a nonobstructing stone on CT.  The stone had previously been present in the right lower pole.  After reviewing the options she is elected ureteroscopy.  Procedure: He was given 2 g of Ancef.  He was taken operating room where general anesthetic was induced.  He was placed in lithotomy position and fitted with PAS hose.  His perineum and genitalia were prepped with Betadine solution and he was draped in usual sterile fashion.  Cystoscopy was performed using a 21 [place]ope and 30 degree lens.  Examination revealed a normal urethra.  The external sphincter was intact.  The prostatic urethra short with mild hyperplasia and a very small middle lobe.  Examination of bladder revealed mild trabeculation.  There were no mucosal lesions.  Ureteral orifices were unremarkable.  The right ureteral orifice was cannulated with a 5 French opening catheter and contrast was instilled.  The right retrograde pyelogram revealed a narrowed intramural ureter approximately 2 cm in length with a filling defect proximal and a small dilated section of ureter however there was no proximal hydronephrosis or other filling defects.  After completion the retrograde pyelogram a sensor wire was advanced the  kidney under fluoroscopic guidance.  The ureteral catheter was removed and a 15 [place] 4 cm high-pressure balloon was advanced over the wire across the intramural ureter.  The balloon was dilated to 20 atm of pressure for approximately 1 minute and then deflated and removed.  The cystoscope was then removed leaving the wire in place.  The dual-lumen semirigid short ureteroscope was then advanced alongside the wire and the stone was visualized.  An attempt was made to remove the stone intact with the engage basket but it did not want to pass the intramural ureter despite the dilation.  The stone was then engaged with the 365 m laser fiber with initial setting of 0.5 J on the holmium laser and 10 Hz with an increase in power to 1 J as needed.  The stone was broken into manageable fragments which were then removed with the engage basket.  Final inspection revealed some mucosal bleeding in the intramural ureter without significant this ruptured but it was felt that a stent would be appropriate.  The cystoscope was reinserted over the wire and a 6 [place] 24 cm contour double-J stent was advanced the kidney under fluoroscopic guidance.  The wire was removed, leaving a good coil in the kidney and a good coil in the bladder.  The cystoscope was removed leaving stent string in place.  The stone fragments flushed out during the bladder drainage and on reinspection alongside the string no additional fragments were seen.  He was taken down from lithotomy position, his anesthetic was reversed and he was moved recovery in stable condition.  There were no complications.  He will be given  his stone fragments to bring to the office for analysis.

## 2021-06-26 NOTE — Progress Notes (Signed)
2-3 attempts made to have BM due to pressure in lower abd.  ABD soft and no additional pain when palpated. Pt educated on constipation prevention and pain control. States pressure is better when he stands and is tolerable with sitting and standing. Stated burning in penis, pyridium administered according to order and patient and spouse educated on time for next dose of this and norco.

## 2021-06-26 NOTE — Anesthesia Procedure Notes (Signed)
Procedure Name: LMA Insertion Date/Time: 06/26/2021 9:20 AM Performed by: Bufford Spikes, CRNA Pre-anesthesia Checklist: Patient identified, Emergency Drugs available, Suction available and Patient being monitored Patient Re-evaluated:Patient Re-evaluated prior to induction Oxygen Delivery Method: Circle system utilized Preoxygenation: Pre-oxygenation with 100% oxygen Induction Type: IV induction Ventilation: Mask ventilation without difficulty LMA: LMA inserted LMA Size: 4.0 Number of attempts: 1 Placement Confirmation: positive ETCO2 Tube secured with: Tape Dental Injury: Teeth and Oropharynx as per pre-operative assessment

## 2021-06-30 ENCOUNTER — Encounter (HOSPITAL_BASED_OUTPATIENT_CLINIC_OR_DEPARTMENT_OTHER): Payer: Self-pay | Admitting: Urology

## 2021-08-11 ENCOUNTER — Other Ambulatory Visit: Payer: Self-pay | Admitting: Medical

## 2021-08-11 DIAGNOSIS — M1A0790 Idiopathic chronic gout, unspecified ankle and foot, without tophus (tophi): Secondary | ICD-10-CM

## 2021-08-20 ENCOUNTER — Other Ambulatory Visit: Payer: Self-pay

## 2021-08-21 ENCOUNTER — Ambulatory Visit (INDEPENDENT_AMBULATORY_CARE_PROVIDER_SITE_OTHER): Payer: Commercial Managed Care - PPO | Admitting: Medical

## 2021-08-21 VITALS — BP 122/70 | HR 67 | Temp 97.7°F | Resp 18 | Ht 68.0 in | Wt 222.4 lb

## 2021-08-21 DIAGNOSIS — Z125 Encounter for screening for malignant neoplasm of prostate: Secondary | ICD-10-CM

## 2021-08-21 DIAGNOSIS — D179 Benign lipomatous neoplasm, unspecified: Secondary | ICD-10-CM | POA: Diagnosis not present

## 2021-08-21 DIAGNOSIS — Z0000 Encounter for general adult medical examination without abnormal findings: Secondary | ICD-10-CM | POA: Diagnosis not present

## 2021-08-21 LAB — CBC WITH DIFFERENTIAL/PLATELET
Basophils Absolute: 0 10*3/uL (ref 0.0–0.1)
Basophils Relative: 0.7 % (ref 0.0–3.0)
Eosinophils Absolute: 0.3 10*3/uL (ref 0.0–0.7)
Eosinophils Relative: 3.7 % (ref 0.0–5.0)
HCT: 46.9 % (ref 39.0–52.0)
Hemoglobin: 15.4 g/dL (ref 13.0–17.0)
Lymphocytes Relative: 29.5 % (ref 12.0–46.0)
Lymphs Abs: 2.1 10*3/uL (ref 0.7–4.0)
MCHC: 32.9 g/dL (ref 30.0–36.0)
MCV: 90.7 fl (ref 78.0–100.0)
Monocytes Absolute: 0.6 10*3/uL (ref 0.1–1.0)
Monocytes Relative: 7.9 % (ref 3.0–12.0)
Neutro Abs: 4.2 10*3/uL (ref 1.4–7.7)
Neutrophils Relative %: 58.2 % (ref 43.0–77.0)
Platelets: 205 10*3/uL (ref 150.0–400.0)
RBC: 5.18 Mil/uL (ref 4.22–5.81)
RDW: 13.3 % (ref 11.5–15.5)
WBC: 7.2 10*3/uL (ref 4.0–10.5)

## 2021-08-21 LAB — COMPREHENSIVE METABOLIC PANEL
ALT: 29 U/L (ref 0–53)
AST: 19 U/L (ref 0–37)
Albumin: 4.2 g/dL (ref 3.5–5.2)
Alkaline Phosphatase: 59 U/L (ref 39–117)
BUN: 14 mg/dL (ref 6–23)
CO2: 29 mEq/L (ref 19–32)
Calcium: 9.6 mg/dL (ref 8.4–10.5)
Chloride: 104 mEq/L (ref 96–112)
Creatinine, Ser: 1.12 mg/dL (ref 0.40–1.50)
GFR: 75.96 mL/min (ref >60.00)
Glucose, Bld: 95 mg/dL (ref 70–99)
Potassium: 5.5 mEq/L — ABNORMAL HIGH (ref 3.5–5.1)
Sodium: 138 mEq/L (ref 135–145)
Total Bilirubin: 0.7 mg/dL (ref 0.2–1.2)
Total Protein: 6.6 g/dL (ref 6.0–8.3)

## 2021-08-21 LAB — LIPID PANEL
Cholesterol: 196 mg/dL (ref 0–200)
HDL: 48.1 mg/dL (ref >39.00)
LDL Cholesterol: 128 mg/dL — ABNORMAL HIGH (ref 0–99)
NonHDL: 147.46
Total CHOL/HDL Ratio: 4
Triglycerides: 99 mg/dL (ref 0.0–149.0)
VLDL: 19.8 mg/dL (ref 0.0–40.0)

## 2021-08-21 LAB — PSA: PSA: 2.52 ng/mL (ref 0.10–4.00)

## 2021-08-21 NOTE — Patient Instructions (Addendum)
For you wellness exam today I have ordered cbc, cmp and lipid panel.  Vaccine appear up to date.  Recommend exercise and healthy diet.  We will let you know lab results as they come in.  Follow up date appointment will be determined after lab review.    Referral to surgeon placed for lipoma evaluation.   Preventive Care 8-52 Years Old, Male Preventive care refers to lifestyle choices and visits with your health care provider that can promote health and wellness. This includes: A yearly physical exam. This is also called an annual wellness visit. Regular dental and eye exams. Immunizations. Screening for certain conditions. Healthy lifestyle choices, such as: Eating a healthy diet. Getting regular exercise. Not using drugs or products that contain nicotine and tobacco. Limiting alcohol use. What can I expect for my preventive care visit? Physical exam Your health care provider will check your: Height and weight. These may be used to calculate your BMI (body mass index). BMI is a measurement that tells if you are at a healthy weight. Heart rate and blood pressure. Body temperature. Skin for abnormal spots. Counseling Your health care provider may ask you questions about your: Past medical problems. Family's medical history. Alcohol, tobacco, and drug use. Emotional well-being. Home life and relationship well-being. Sexual activity. Diet, exercise, and sleep habits. Work and work Statistician. Access to firearms. What immunizations do I need?  Vaccines are usually given at various ages, according to a schedule. Your health care provider will recommend vaccines for you based on your age, medicalhistory, and lifestyle or other factors, such as travel or where you work. What tests do I need? Blood tests Lipid and cholesterol levels. These may be checked every 5 years, or more often if you are over 58 years old. Hepatitis C test. Hepatitis B test. Screening Lung cancer  screening. You may have this screening every year starting at age 73 if you have a 30-pack-year history of smoking and currently smoke or have quit within the past 15 years. Prostate cancer screening. Recommendations will vary depending on your family history and other risks. Genital exam to check for testicular cancer or hernias. Colorectal cancer screening. All adults should have this screening starting at age 84 and continuing until age 6. Your health care provider may recommend screening at age 16 if you are at increased risk. You will have tests every 1-10 years, depending on your results and the type of screening test. Diabetes screening. This is done by checking your blood sugar (glucose) after you have not eaten for a while (fasting). You may have this done every 1-3 years. STD (sexually transmitted disease) testing, if you are at risk. Follow these instructions at home: Eating and drinking  Eat a diet that includes fresh fruits and vegetables, whole grains, lean protein, and low-fat dairy products. Take vitamin and mineral supplements as recommended by your health care provider. Do not drink alcohol if your health care provider tells you not to drink. If you drink alcohol: Limit how much you have to 0-2 drinks a day. Be aware of how much alcohol is in your drink. In the [place] one drink equals one 12 oz bottle of beer (355 mL), one 5 oz glass of wine (148 mL), or one 1 oz glass of hard liquor (44 mL).  Lifestyle Take daily care of your teeth and gums. Brush your teeth every morning and night with fluoride toothpaste. Floss one time each day. Stay active. Exercise for at least 30 minutes 5 or  more days each week. Do not use any products that contain nicotine or tobacco, such as cigarettes, e-cigarettes, and chewing tobacco. If you need help quitting, ask your health care provider. Do not use drugs. If you are sexually active, practice safe sex. Use a condom or other form of  protection to prevent STIs (sexually transmitted infections). If told by your health care provider, take low-dose aspirin daily starting at age 62. Find healthy ways to cope with stress, such as: Meditation, yoga, or listening to music. Journaling. Talking to a trusted person. Spending time with friends and family. Safety Always wear your seat belt while driving or riding in a vehicle. Do not drive: If you have been drinking alcohol. Do not ride with someone who has been drinking. When you are tired or distracted. While texting. Wear a helmet and other protective equipment during sports activities. If you have firearms in your house, make sure you follow all gun safety procedures. What's next? Go to your health care provider once a year for an annual wellness visit. Ask your health care provider how often you should have your eyes and teeth checked. Stay up to date on all vaccines. This information is not intended to replace advice given to you by your health care provider. Make sure you discuss any questions you have with your healthcare provider. Document Revised: 09/11/2019 Document Reviewed: 12/07/2018 Elsevier Patient Education  2022 [employer].

## 2021-08-21 NOTE — Progress Notes (Signed)
Subjective:    Patient ID: Joseph [name], male    DOB: 11-12-1971, 50 y.o.   MRN: [id number]  HPI  Pt in for cpe/wellness.  Pt has not been exercising. He has been trying to eat healthy.  Non smoker. Drink 3 cups of coffee a day.   Pt up to date on colonoscopy.  Up to date on shingles vaccine.   Review of Systems  Constitutional:  Negative for chills, fatigue and fever.  HENT:  Negative for dental problem.   Respiratory:  Negative for cough, chest tightness, shortness of breath and wheezing.   Gastrointestinal:  Negative for abdominal pain.  Genitourinary:  Negative for enuresis, frequency and hematuria.  Musculoskeletal:  Negative for back pain, joint swelling and myalgias.  Skin:  Negative for rash.  Neurological:  Negative for dizziness, speech difficulty, weakness, numbness and headaches.  Hematological:  Negative for adenopathy. Does not bruise/bleed easily.  Psychiatric/Behavioral:  Negative for behavioral problems and dysphoric mood. The patient is not nervous/anxious and is not hyperactive.     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   Glaucoma 06/24/2021   BOTH EYES   Gout    NO RECENT FLARES AS OF 06-25-2021   History of degenerative disc disease 03/24/2017   History of kidney stones 03/24/2017   HTN (hypertension) 10/10/2017   Lipoma 03/24/2017   Tinnitus 03/24/2017   Wears glasses 06/24/2021     Social History   Socioeconomic History   Marital status: Single    Spouse name: Not on file   Number of children: Not on file   Years of education: Not on file   Highest education level: Not on file  Occupational History   Not on file  Tobacco Use   Smoking status: Never   Smokeless tobacco: Never  Vaping Use   Vaping Use: Never used  Substance and Sexual Activity   Alcohol use: Yes    Alcohol/week: 2.0 standard drinks    Types: 2 Cans of beer per week    Comment: [place]   Drug  use: No   Sexual activity: Not on file  Other Topics Concern   Not on file  Social History Narrative   Works as Marine scientist, no dietary restrictions.   Social Determinants of Health   Financial Resource Strain: Not on file  Food Insecurity: Not on file  Transportation Needs: Not on file  Physical Activity: Not on file  Stress: Not on file  Social Connections: Not on file  Intimate Partner Violence: Not on file    Past Surgical History:  Procedure Laterality Date   APPENDECTOMY     AGE [place], URETEROSCOPY AND STENT PLACEMENT Right 06/26/2021   Procedure: CYSTOSCOPY WITH RIGHT RETROGRADE / RIGHT URETERAL DILATION/ URETEROSCOPY HOLMIUM LASER AND STENT PLACEMENT;  Surgeon: Irine Seal, MD;  Location: [facility];  Service: Urology;  Laterality: Right;    Family History  Problem Relation Age of Onset   Diabetes Mother    Glaucoma Father    Hypertension Father     No Known Allergies  Current Outpatient Medications on File Prior to Visit  Medication Sig Dispense Refill   allopurinol (ZYLOPRIM) 100 MG tablet TAKE TWO TABLETS BY MOUTH DAILY 180 tablet 0   diclofenac (VOLTAREN) 75 MG EC tablet TAKE ONE TABLET BY MOUTH TWICE DAILY *used gout flare and take with food* (  Patient taking differently: as needed.) 180 tablet 0   latanoprost (XALATAN) 0.005 % ophthalmic solution 1 drop at bedtime.     losartan (COZAAR) 50 MG tablet TAKE ONE TABLET BY MOUTH ONE TIME DAILY 90 tablet 0   metoprolol succinate (TOPROL-XL) 25 MG 24 hr tablet TAKE ONE TABLET BY MOUTH ONE TIME DAILY 90 tablet 0   TAMSULOSIN HCL PO Take by mouth.     VITAMIN D PO Take by mouth. 2000 UNITS VITAMIN D QOD     No current facility-administered medications on file prior to visit.    BP 122/70 (BP Location: Left Arm, Patient Position: Sitting, Cuff Size: Normal)   Pulse 67   Temp 97.7 F (36.5 C) (Oral)   Resp 18   Ht '5\' 8"'$  (1.727 m)   Wt 222 lb  6.4 oz (100.9 kg)   SpO2 98%   BMI 33.82 kg/m       Objective:   Physical Exam  General Mental Status- Alert. General Appearance- Not in acute distress.   Skin General: Color- Normal Color. Moisture- Normal Moisture.  Neck Carotid Arteries- Normal color. Moisture- Normal Moisture. No carotid bruits. No JVD.  Chest and Lung Exam Auscultation: Breath Sounds:-Normal.  Cardiovascular Auscultation:Rythm- Regular. Murmurs & Other Heart Sounds:Auscultation of the heart reveals- No Murmurs.  Abdomen Inspection:-Inspeection Normal. Palpation/Percussion:Note:No mass. Palpation and Percussion of the abdomen reveal- Non Tender, Non Distended + BS, no rebound or guarding.   Neurologic Cranial Nerve exam:- CN III-XII intact(No nystagmus), symmetric smile. Strength:- 5/5 equal and symmetric strength both upper and lower extremities.   Left latisumuss dorsi area- moderate large lipoma.    Assessment & Plan:  For you wellness exam today I have ordered cbc, cmp and lipid panel.  Vaccine appear up to date.  Recommend exercise and healthy diet.  We will let you know lab results as they come in.  Follow up date appointment will be determined after lab review.    Mackie Pai, PA-C

## 2021-08-27 ENCOUNTER — Other Ambulatory Visit: Payer: Self-pay | Admitting: *Deleted

## 2021-08-27 DIAGNOSIS — E875 Hyperkalemia: Secondary | ICD-10-CM

## 2021-08-28 ENCOUNTER — Encounter: Payer: Self-pay | Admitting: Medical

## 2021-08-28 ENCOUNTER — Other Ambulatory Visit: Payer: Self-pay

## 2021-08-28 ENCOUNTER — Other Ambulatory Visit (INDEPENDENT_AMBULATORY_CARE_PROVIDER_SITE_OTHER): Payer: Commercial Managed Care - PPO

## 2021-08-28 DIAGNOSIS — E875 Hyperkalemia: Secondary | ICD-10-CM

## 2021-08-28 LAB — COMPREHENSIVE METABOLIC PANEL
ALT: 32 U/L (ref 0–53)
AST: 18 U/L (ref 0–37)
Albumin: 4.3 g/dL (ref 3.5–5.2)
Alkaline Phosphatase: 61 U/L (ref 39–117)
BUN: 22 mg/dL (ref 6–23)
CO2: 27 mEq/L (ref 19–32)
Calcium: 9.7 mg/dL (ref 8.4–10.5)
Chloride: 102 mEq/L (ref 96–112)
Creatinine, Ser: 1.12 mg/dL (ref 0.40–1.50)
GFR: 75.94 mL/min (ref >60.00)
Glucose, Bld: 159 mg/dL — ABNORMAL HIGH (ref 70–99)
Potassium: 4.4 mEq/L (ref 3.5–5.1)
Sodium: 137 mEq/L (ref 135–145)
Total Bilirubin: 0.7 mg/dL (ref 0.2–1.2)
Total Protein: 6.6 g/dL (ref 6.0–8.3)

## 2021-11-25 ENCOUNTER — Other Ambulatory Visit: Payer: Self-pay | Admitting: Medical

## 2021-11-25 DIAGNOSIS — M1A0790 Idiopathic chronic gout, unspecified ankle and foot, without tophus (tophi): Secondary | ICD-10-CM

## 2022-02-17 ENCOUNTER — Other Ambulatory Visit: Payer: Self-pay | Admitting: Medical

## 2022-02-17 DIAGNOSIS — M1A0790 Idiopathic chronic gout, unspecified ankle and foot, without tophus (tophi): Secondary | ICD-10-CM

## 2022-05-13 ENCOUNTER — Other Ambulatory Visit: Payer: Self-pay | Admitting: Medical

## 2022-05-14 ENCOUNTER — Telehealth: Payer: Self-pay

## 2022-05-14 NOTE — Telephone Encounter (Signed)
[name] Key: [id number] - PA Case ID: [id number] - Rx #: [id number] Outcome Additional Information Required [facility] does not handle this review. Please visit [email or web address] to start a PA Losartan Potassium '50MG'$  tablets    PA started via phone Prior Auth ([place]) ID: [id number]

## 2022-05-17 ENCOUNTER — Other Ambulatory Visit: Payer: Self-pay | Admitting: Medical

## 2022-05-17 DIAGNOSIS — M1A0790 Idiopathic chronic gout, unspecified ankle and foot, without tophus (tophi): Secondary | ICD-10-CM

## 2022-05-28 ENCOUNTER — Encounter: Payer: Self-pay | Admitting: Medical

## 2022-05-28 ENCOUNTER — Ambulatory Visit: Payer: Commercial Managed Care - PPO | Admitting: Medical

## 2022-05-28 VITALS — BP 128/78 | HR 74 | Temp 98.0°F | Resp 18 | Ht 68.0 in | Wt 220.0 lb

## 2022-05-28 DIAGNOSIS — E782 Mixed hyperlipidemia: Secondary | ICD-10-CM | POA: Diagnosis not present

## 2022-05-28 DIAGNOSIS — I10 Essential (primary) hypertension: Secondary | ICD-10-CM

## 2022-05-28 DIAGNOSIS — M1A0790 Idiopathic chronic gout, unspecified ankle and foot, without tophus (tophi): Secondary | ICD-10-CM | POA: Diagnosis not present

## 2022-05-28 DIAGNOSIS — R5383 Other fatigue: Secondary | ICD-10-CM

## 2022-05-28 DIAGNOSIS — H9313 Tinnitus, bilateral: Secondary | ICD-10-CM

## 2022-05-28 DIAGNOSIS — R972 Elevated prostate specific antigen [PSA]: Secondary | ICD-10-CM | POA: Diagnosis not present

## 2022-05-28 LAB — COMPREHENSIVE METABOLIC PANEL
ALT: 28 U/L (ref 0–53)
AST: 18 U/L (ref 0–37)
Albumin: 4.5 g/dL (ref 3.5–5.2)
Alkaline Phosphatase: 65 U/L (ref 39–117)
BUN: 19 mg/dL (ref 6–23)
CO2: 30 mEq/L (ref 19–32)
Calcium: 10 mg/dL (ref 8.4–10.5)
Chloride: 102 mEq/L (ref 96–112)
Creatinine, Ser: 1.06 mg/dL (ref 0.40–1.50)
GFR: 80.71 mL/min (ref >60.00)
Glucose, Bld: 99 mg/dL (ref 70–99)
Potassium: 4.9 mEq/L (ref 3.5–5.1)
Sodium: 138 mEq/L (ref 135–145)
Total Bilirubin: 0.8 mg/dL (ref 0.2–1.2)
Total Protein: 7 g/dL (ref 6.0–8.3)

## 2022-05-28 LAB — LIPID PANEL
Cholesterol: 192 mg/dL (ref 0–200)
HDL: 51.5 mg/dL (ref >39.00)
LDL Cholesterol: 122 mg/dL — ABNORMAL HIGH (ref 0–99)
NonHDL: 140.11
Total CHOL/HDL Ratio: 4
Triglycerides: 92 mg/dL (ref 0.0–149.0)
VLDL: 18.4 mg/dL (ref 0.0–40.0)

## 2022-05-28 LAB — PSA: PSA: 4.03 ng/mL — ABNORMAL HIGH (ref 0.10–4.00)

## 2022-05-28 NOTE — Progress Notes (Signed)
Subjective:    Patient ID: Joseph [name], male    DOB: [birth year], 51 y.o.   MRN: [id number]  HPI  Pt in for follow up.   Htn-  Pt bp is controlled   Pt has history of tinnitus on both sides.. Signs and symptoms come and go. Only faint ringing rt side now. He had this for 10 years. Went to ENT. Given advise to use other sounds such as music to cover up sound. Pt knows of someone who had similar issue and mri done. Found issues that corrected the problem. So wonders if mri indicated.   Discussed with pt below in ".   The cause of tinnitus is often not known. In some cases, it can result from: Exposure to loud noises from machinery, music, or other sources. An object (foreign body) stuck in the ear. Earwax buildup. Drinking alcohol or caffeine. Taking certain medicines. Age-related hearing loss. It may also be caused by medical conditions such as: Ear or sinus infections. Heart diseases or high blood pressure. Allergies. Mnire's disease. Thyroid problems. Tumors. A weak, bulging blood vessel (aneurysm) near the ear.   Pt states no recent gout flares. Pt is still on allopurinol.   On review minimal increased prostate velocity. He request psa. No urinary symptoms today.  Review of Systems  Constitutional:  Negative for chills and fatigue.  HENT:  Negative for congestion and drooling.   Respiratory:  Negative for choking, shortness of breath and wheezing.   Cardiovascular:  Negative for chest pain and palpitations.  Gastrointestinal:  Negative for constipation and nausea.  Genitourinary:  Negative for dysuria, frequency and genital sores.    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   Glaucoma 06/24/2021   BOTH EYES   Gout    NO RECENT FLARES AS OF 06-25-2021   History of degenerative disc disease 03/24/2017   History of kidney stones 03/24/2017   HTN (hypertension) 10/10/2017   Lipoma 03/24/2017    Tinnitus 03/24/2017   Wears glasses 06/24/2021     Social History   Socioeconomic History   Marital status: Single    Spouse name: Not on file   Number of children: Not on file   Years of education: Not on file   Highest education level: Not on file  Occupational History   Not on file  Tobacco Use   Smoking status: Never   Smokeless tobacco: Never  Vaping Use   Vaping Use: Never used  Substance and Sexual Activity   Alcohol use: Yes    Alcohol/week: 2.0 standard drinks    Types: 2 Cans of beer per week    Comment: [place]   Drug use: No   Sexual activity: Not on file  Other Topics Concern   Not on file  Social History Narrative   Works as Marine scientist, no dietary restrictions.   Social Determinants of Health   Financial Resource Strain: Not on file  Food Insecurity: Not on file  Transportation Needs: Not on file  Physical Activity: Not on file  Stress: Not on file  Social Connections: Not on file  Intimate Partner Violence: Not on file    Past Surgical History:  Procedure Laterality Date   APPENDECTOMY     AGE [place], URETEROSCOPY AND STENT PLACEMENT Right 06/26/2021   Procedure: CYSTOSCOPY WITH RIGHT RETROGRADE / RIGHT URETERAL DILATION/ URETEROSCOPY HOLMIUM LASER  AND STENT PLACEMENT;  Surgeon: Irine Seal, MD;  Location: [facility];  Service: Urology;  Laterality: Right;    Family History  Problem Relation Age of Onset   Diabetes Mother    Glaucoma Father    Hypertension Father     No Known Allergies  Current Outpatient Medications on File Prior to Visit  Medication Sig Dispense Refill   allopurinol (ZYLOPRIM) 100 MG tablet TAKE TWO TABLETS BY MOUTH DAILY 180 tablet 0   diclofenac (VOLTAREN) 75 MG EC tablet TAKE ONE TABLET BY MOUTH TWICE DAILY *used gout flare and take with food* (Patient taking differently: as needed.) 180 tablet 0   latanoprost (XALATAN) 0.005 % ophthalmic  solution 1 drop at bedtime.     losartan (COZAAR) 50 MG tablet TAKE ONE TABLET BY MOUTH ONE TIME DAILY 90 tablet 0   metoprolol succinate (TOPROL-XL) 25 MG 24 hr tablet TAKE ONE TABLET BY MOUTH ONE TIME DAILY 90 tablet 0   VITAMIN D PO Take by mouth. 2000 UNITS VITAMIN D QOD     TAMSULOSIN HCL PO Take by mouth.     No current facility-administered medications on file prior to visit.    BP (!) 120/55   Pulse 74   Temp 98 F (36.7 C)   Resp 18   Ht '5\' 8"'$  (1.727 m)   Wt 220 lb (99.8 kg)   SpO2 99%   BMI 33.45 kg/m       Objective:   Physical Exam  General Mental Status- Alert. General Appearance- Not in acute distress.   Skin General: Color- Normal Color. Moisture- Normal Moisture.  Neck Carotid Arteries- Normal color. Moisture- Normal Moisture. No carotid bruits. No JVD.  Chest and Lung Exam Auscultation: Breath Sounds:-Normal.  Cardiovascular Auscultation:Rythm- Regular. Murmurs & Other Heart Sounds:Auscultation of the heart reveals- No Murmurs.  Abdomen Inspection:-Inspeection Normal. Palpation/Percussion:Note:No mass. Palpation and Percussion of the abdomen reveal- Non Tender, Non Distended + BS, no rebound or guarding.   Neurologic Cranial Nerve exam:- CN III-XII intact(No nystagmus), symmetric smile. Drift Test:- No drift. Romberg Exam:- Negative.  Heal to Toe Gait exam:-Normal. Finger to Nose:- Normal/Intact Strength:- 5/5 equal and symmetric strength both upper and lower extremities.       Assessment & Plan:   Patient Instructions  Htn- your bp is well controlled today. Continue losartan 50 mg daily and toprol xl 25 mg daily. To recommend if you  For controlled gout continue allopurinol and get uric acid today and cmp.  For minimal fatigue and for part of tinnitus work up will get tsh and t4.  For high cholesterol recommend low cholesterol diet. Lipid panel today.  Will get psa for minimal slight increased prostates velocity.  Follow up in 3  months or sooner if needed.      Mackie Pai, PA-C

## 2022-05-28 NOTE — Patient Instructions (Addendum)
Htn- your bp is well controlled today. Continue losartan 50 mg daily and toprol xl 25 mg daily. To recommend if you  For controlled gout continue allopurinol and get uric acid today and cmp.  For minimal fatigue and for part of tinnitus work up will get tsh and t4.  For high cholesterol recommend low cholesterol diet. Lipid panel today.  Will get psa for minimal slight increased prostates velocity.  Follow up in 3 months or sooner if needed.  Tinnitus Tinnitus El trmino tinnitus hace referencia a la percepcin de un sonido que no se corresponde con ninguna fuente real para ese sonido. A menudo se lo describe como zumbido de odos. Sin embargo, las personas que sufren esta afeccin pueden percibir diferentes ruidos, en uno o ambos odos. Los sonidos del tinnitus pueden ser [place], fuertes o de intensidad intermedia. El tinnitus puede prolongarse pocos segundos o ser constante [place]. Puede desaparecer sin tratamiento y regresar en distintos momentos. Cuando el tinnitus es permanente u ocurre con frecuencia, puede causar otros problemas, por ejemplo, dificultad para dormir y para concentrarse. Casi todas las Engineer, manufacturing tinnitus en algn momento. El tinnitus no es lo mismo que la prdida [place]. El tinnitus a Barrister's clerk (crnico) o que regresa con frecuencia (recurrente) es un problema que puede requerir Geophysical data processor. Cules son las causas? A menudo se desconoce la causa del tinnitus. En algunos casos, puede ser consecuencia de lo siguiente: Exposicin a ruidos fuertes de maquinarias, [place] u otras fuentes. Un objeto (cuerpo extrao) atascado en el odo. Acumulacin de cerumen. El consumo de alcohol o cafena. Tomar ciertos medicamentos. La prdida [place] relacionada con la edad. Tambin puede ser causada por afecciones mdicas tales como: Infecciones de los odos o de los senos paranasales. Enfermedades cardacas o hipertensin arterial. Alergias. Enfermedad de  [place]. Problemas de tiroides. Tumores. Un vaso sanguneo dbil o abultado (aneurisma) cerca del odo. Qu incrementa el riesgo? Los siguientes factores pueden hacer que sea ms propenso a Emergency planning/management officer afeccin: Exposicin a ruidos fuertes. La edad. Es ms probable que el tinnitus se presente en personas de edad avanzada. Consumo de alcohol o tabaco. Cules son los signos o sntomas? El principal sntoma de tinnitus es la percepcin de un sonido que no se corresponde con ninguna fuente, no proviene de Horticulturist, commercial. El sonido puede percibirse como lo siguiente: Vibracin. Chisporroteo. Zumbido. Soplido de aire. Sibilancia. Silbido. Otros sonidos pueden ser: Crepitacin. Una corriente de agua. Una nota musical. Golpecitos. Runrn. Es posible que los sntomas afecten un solo odo (unilateral) o ambos odos (bilateral). Cmo se diagnostica? El tinnitus se diagnostica en funcin de los sntomas, antecedentes mdicos y un examen fsico. El mdico puede realizar una prueba auditiva exhaustiva (examen de audicin) si el tinnitus: Es unilateral. Causa dificultades [phone]. Dura ms de 6 meses. Usted puede trabajar con un mdico especialista en trastornos [employer] (audilogo). Le pueden hacer preguntas sobre sus sntomas y cmo estos afectan su vida diaria. Es posible que le hagan algunas pruebas, por ejemplo: Exploracin por tomografa computarizada (TC). Resonancia magntica (RM). Un estudio de diagnstico por imgenes de cmo fluye la sangre a travs de los vasos sanguneos (angiograma). Cmo se trata? A veces, el tratamiento de una enfermedad preexistente hace que el tinnitus desaparezca. Si el tinnitus contina, probablemente debe realizar [employer] siguientes tratamientos, [place] otros: Terapia y orientacin para ayudarlo a Chief Technology Officer el estrs que significa vivir con tinnitus. Generadores de sonido para enmascarar el tinnitus. Esto incluye lo siguiente: Aparatos de sonido  de mesa que reproducen sonidos relajantes para ayudarlo a dormir. Dispositivos inteligentes que se adaptan al odo y reproducen sonidos o [place]. Estimulacin Facilities manager usar auriculares para escuchar msica que contiene Scientist, research (physical sciences). Con el tiempo, escuchar esta seal puede modificar las redes del cerebro y reducir la sensibilidad al tinnitus. Este tratamiento se [place] en casos muy graves cuando ningn otro tratamiento resulta eficaz. El uso de audfonos o implantes cocleares, si el tinnitus guarda relacin con la prdida de la audicin. Los audfonos se usan en el odo externo. Implantes cocleares se colocan quirrgicamente en el odo interno. Siga estas instrucciones en su casa: Controlar los sntomas     Cuando sea posible, no permanezca en lugares ruidosos y no se exponga a sonidos fuertes. Use dispositivos de proteccin de la audicin, por ejemplo, tapones, cuando est expuesto a ruidos fuertes. Use un aparato de sonido de fondo, un humidificador u otros dispositivos para enmascarar el sonido del tinnitus. Ponga en prctica tcnicas para reducir el estrs, como meditacin, yoga o respiracin profunda. Trabaje con el mdico si necesita ayuda para Engineer, maintenance (IT). Duerma con la cabeza levemente elevada. Esto puede reducir el impacto del tinnitus. Instrucciones generales No use sustancias estimulantes, como nicotina, alcohol o cafena. Hable con el mdico sobre otros estimulantes que Product/process development scientist. Los estimulantes son sustancias que pueden hacer que se sienta alerta y atento al aumentar determinadas actividades en el cuerpo (por ejemplo, la frecuencia cardaca y la presin arterial). Estas sustancias pueden empeorar el tinnitus. Use los medicamentos de venta libre y los recetados solamente como se lo haya indicado el mdico. Intente dormir mucho todas las noches. Concurra a [place]. Esto es importante. Comunquese con un mdico si: El tinnitus  se prolonga durante 3 semanas o ms tiempo y no se detiene. Pierde la audicin de forma repentina. Los sntomas empeoran o no mejoran con los Multimedia programmer. Siente que no puede controlar el estrs que le provoca vivir con tinnitus. Solicite ayuda de inmediato si: Experimenta tinnitus despus de sufrir una lesin en la cabeza. Tiene tinnitus junto con alguno de estos sntomas: Mareos. Nuseas y vmitos. Prdida del equilibrio. Dolor de cabeza repentino e intenso. Cambios en la visin. Debilidad facial o debilidad de brazos o piernas. Estos sntomas pueden representar un problema grave que constituye Engineer, maintenance (IT). No espere a ver si los sntomas desaparecen. Solicite atencin mdica de inmediato. Comunquese con el servicio de emergencias de su localidad (911 en los [place]). No conduzca por sus propios medios [employer] hospital. Resumen El trmino tinnitus hace referencia a la percepcin de un sonido que no se corresponde con ninguna fuente real para ese sonido. A menudo se lo describe como zumbido de odos. Es posible que los sntomas afecten un solo odo (unilateral) o ambos odos (bilateral). Use un aparato de sonido de fondo, un humidificador u otros dispositivos para enmascarar el sonido del tinnitus. No use sustancias estimulantes, como nicotina, alcohol o cafena. Estas sustancias pueden empeorar el tinnitus. Esta informacin no tiene Marine scientist el consejo del mdico. Asegrese de hacerle al mdico cualquier pregunta que tenga. Document Revised: 12/08/2020 Document Reviewed: 12/08/2020 Elsevier Patient Education  [place].

## 2022-05-29 NOTE — Addendum Note (Signed)
Addended by: Anabel Halon on: 05/29/2022 10:02 AM   Modules accepted: Orders

## 2022-06-30 DIAGNOSIS — H903 Sensorineural hearing loss, bilateral: Secondary | ICD-10-CM | POA: Insufficient documentation

## 2022-08-08 ENCOUNTER — Other Ambulatory Visit: Payer: Self-pay | Admitting: Medical

## 2022-11-07 ENCOUNTER — Other Ambulatory Visit: Payer: Self-pay | Admitting: Medical

## 2022-11-08 ENCOUNTER — Other Ambulatory Visit: Payer: Self-pay | Admitting: Medical

## 2022-11-08 DIAGNOSIS — M1A0790 Idiopathic chronic gout, unspecified ankle and foot, without tophus (tophi): Secondary | ICD-10-CM

## 2022-11-12 ENCOUNTER — Encounter: Payer: Self-pay | Admitting: Medical

## 2022-11-12 ENCOUNTER — Ambulatory Visit (INDEPENDENT_AMBULATORY_CARE_PROVIDER_SITE_OTHER): Payer: Commercial Managed Care - PPO | Admitting: Medical

## 2022-11-12 VITALS — BP 130/78 | HR 80 | Temp 97.8°F | Resp 18 | Ht 68.0 in | Wt 216.4 lb

## 2022-11-12 DIAGNOSIS — M1A0790 Idiopathic chronic gout, unspecified ankle and foot, without tophus (tophi): Secondary | ICD-10-CM

## 2022-11-12 DIAGNOSIS — Z0000 Encounter for general adult medical examination without abnormal findings: Secondary | ICD-10-CM

## 2022-11-12 DIAGNOSIS — Z23 Encounter for immunization: Secondary | ICD-10-CM | POA: Diagnosis not present

## 2022-11-12 LAB — CBC WITH DIFFERENTIAL/PLATELET
Basophils Absolute: 0 10*3/uL (ref 0.0–0.1)
Basophils Relative: 0.6 % (ref 0.0–3.0)
Eosinophils Absolute: 0.3 10*3/uL (ref 0.0–0.7)
Eosinophils Relative: 3.5 % (ref 0.0–5.0)
HCT: 46.7 % (ref 39.0–52.0)
Hemoglobin: 15.5 g/dL (ref 13.0–17.0)
Lymphocytes Relative: 28.2 % (ref 12.0–46.0)
Lymphs Abs: 2.4 10*3/uL (ref 0.7–4.0)
MCHC: 33.1 g/dL (ref 30.0–36.0)
MCV: 88.6 fl (ref 78.0–100.0)
Monocytes Absolute: 0.6 10*3/uL (ref 0.1–1.0)
Monocytes Relative: 6.7 % (ref 3.0–12.0)
Neutro Abs: 5.2 10*3/uL (ref 1.4–7.7)
Neutrophils Relative %: 61 % (ref 43.0–77.0)
Platelets: 250 10*3/uL (ref 150.0–400.0)
RBC: 5.27 Mil/uL (ref 4.22–5.81)
RDW: 13.3 % (ref 11.5–15.5)
WBC: 8.5 10*3/uL (ref 4.0–10.5)

## 2022-11-12 LAB — COMPREHENSIVE METABOLIC PANEL
ALT: 20 U/L (ref 0–53)
AST: 18 U/L (ref 0–37)
Albumin: 4.5 g/dL (ref 3.5–5.2)
Alkaline Phosphatase: 68 U/L (ref 39–117)
BUN: 18 mg/dL (ref 6–23)
CO2: 30 mEq/L (ref 19–32)
Calcium: 9.5 mg/dL (ref 8.4–10.5)
Chloride: 102 mEq/L (ref 96–112)
Creatinine, Ser: 1.07 mg/dL (ref 0.40–1.50)
GFR: 79.54 mL/min (ref >60.00)
Glucose, Bld: 94 mg/dL (ref 70–99)
Potassium: 4.8 mEq/L (ref 3.5–5.1)
Sodium: 136 mEq/L (ref 135–145)
Total Bilirubin: 0.7 mg/dL (ref 0.2–1.2)
Total Protein: 6.8 g/dL (ref 6.0–8.3)

## 2022-11-12 LAB — LIPID PANEL
Cholesterol: 186 mg/dL (ref 0–200)
HDL: 51.2 mg/dL (ref >39.00)
LDL Cholesterol: 116 mg/dL — ABNORMAL HIGH (ref 0–99)
NonHDL: 134.99
Total CHOL/HDL Ratio: 4
Triglycerides: 96 mg/dL (ref 0.0–149.0)
VLDL: 19.2 mg/dL (ref 0.0–40.0)

## 2022-11-12 MED ORDER — LOSARTAN POTASSIUM 50 MG PO TABS: 50.0000 mg | ORAL_TABLET | Freq: Every day | ORAL | 3 refills | Status: DC

## 2022-11-12 MED ORDER — METOPROLOL SUCCINATE ER 25 MG PO TB24: 25.0000 mg | ORAL_TABLET | Freq: Every day | ORAL | 3 refills | Status: DC

## 2022-11-12 MED ORDER — ALLOPURINOL 300 MG PO TABS: 300.0000 mg | ORAL_TABLET | Freq: Every day | ORAL | 3 refills | Status: DC

## 2022-11-12 NOTE — Progress Notes (Signed)
Subjective:    Patient ID: Joseph [name], male    DOB: 02-09-1971, 51 y.o.   MRN: [id number]  HPI Expand All Collapse All    Subjective:      Subjective '[]'$ Expand by Default Patient ID: Joseph [name], male    DOB: Dec 21, 1971, 51 y.o.   MRN: [id number]   HPI   Pt in for cpe/wellness.   Pt has not been exercising. He has been trying to eat healthy.  Non smoker. Drink 3 cups of coffee a day.   Up to date on colonoscopy. He states done 2020 and was normal.  Psa recently done by urologist and told no concerns per pt.  Shingrix vaccine today.Marland Kitchen  He states had flu vaccine and covid this fall already.       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   Glaucoma 06/24/2021   BOTH EYES   Gout    NO RECENT FLARES AS OF 06-25-2021   History of degenerative disc disease 03/24/2017   History of kidney stones 03/24/2017   HTN (hypertension) 10/10/2017   Lipoma 03/24/2017   Tinnitus 03/24/2017   Wears glasses 06/24/2021     Social History   Socioeconomic History   Marital status: Single    Spouse name: Not on file   Number of children: Not on file   Years of education: Not on file   Highest education level: Not on file  Occupational History   Not on file  Tobacco Use   Smoking status: Never   Smokeless tobacco: Never  Vaping Use   Vaping Use: Never used  Substance and Sexual Activity   Alcohol use: Yes    Alcohol/week: 2.0 standard drinks of alcohol    Types: 2 Cans of beer per week    Comment: [place]   Drug use: No   Sexual activity: Not on file  Other Topics Concern   Not on file  Social History Narrative   Works as Marine scientist, no dietary restrictions.   Social Determinants of Health   Financial Resource Strain: Not on file  Food Insecurity: Not on file  Transportation Needs: Not on file  Physical Activity: Not on file  Stress: Not on file  Social Connections: Not  on file  Intimate Partner Violence: Not on file    Past Surgical History:  Procedure Laterality Date   APPENDECTOMY     AGE [place], URETEROSCOPY AND STENT PLACEMENT Right 06/26/2021   Procedure: CYSTOSCOPY WITH RIGHT RETROGRADE / RIGHT URETERAL DILATION/ URETEROSCOPY HOLMIUM LASER AND STENT PLACEMENT;  Surgeon: Irine Seal, MD;  Location: [facility];  Service: Urology;  Laterality: Right;    Family History  Problem Relation Age of Onset   Diabetes Mother    Glaucoma Father    Hypertension Father     No Known Allergies  Current Outpatient Medications on File Prior to Visit  Medication Sig Dispense Refill   allopurinol (ZYLOPRIM) 100 MG tablet Take 2 tablets (200 mg total) by mouth daily. 180 tablet 0   diclofenac (VOLTAREN) 75 MG EC tablet TAKE ONE TABLET BY MOUTH TWICE DAILY *used gout flare and take with food* (Patient taking differently: as needed.) 180 tablet 0   latanoprost (XALATAN) 0.005 % ophthalmic solution 1 drop at bedtime.     losartan (COZAAR) 50 MG tablet TAKE ONE TABLET BY MOUTH ONE TIME DAILY 90  tablet 0   metoprolol succinate (TOPROL-XL) 25 MG 24 hr tablet TAKE ONE TABLET BY MOUTH ONE TIME DAILY 90 tablet 0   VITAMIN D PO Take by mouth. 2000 UNITS VITAMIN D QOD     No current facility-administered medications on file prior to visit.    BP 130/78 (BP Location: Left Arm, Patient Position: Sitting, Cuff Size: Large)   Pulse 80   Temp 97.8 F (36.6 C) (Oral)   Resp 18   Ht '5\' 8"'$  (1.727 m)   Wt 216 lb 6.4 oz (98.2 kg)   SpO2 97%   BMI 32.90 kg/m     Review of Systems  Constitutional:  Negative for chills, fatigue and fever.  HENT:  Negative for dental problem.   Respiratory:  Negative for cough, chest tightness, shortness of breath and wheezing.   Gastrointestinal:  Negative for abdominal pain.  Genitourinary:  Negative for enuresis, frequency and hematuria.  Musculoskeletal:  Negative for back pain, joint  swelling and myalgias.  Skin:  Negative for rash.  Neurological:  Negative for dizziness, speech difficulty, weakness, numbness and headaches.  Hematological:  Negative for adenopathy. Does not bruise/bleed easily.  Psychiatric/Behavioral:  Negative for behavioral problems and dysphoric mood. The patient is not nervous/anxious and is not hyperactive.        Objective:   Physical Exam General Mental Status- Alert. General Appearance- Not in acute distress.   Skin General: Color- Normal Color. Moisture- Normal Moisture.  Neck Carotid Arteries- Normal color. Moisture- Normal Moisture. No carotid bruits. No JVD.  Chest and Lung Exam Auscultation: Breath Sounds:-Normal.  Cardiovascular Auscultation:Rythm- Regular. Murmurs & Other Heart Sounds:Auscultation of the heart reveals- No Murmurs.  Abdomen Inspection:-Inspeection Normal. Palpation/Percussion:Note:No mass. Palpation and Percussion of the abdomen reveal- Non Tender, Non Distended + BS, no rebound or guarding.   Neurologic Cranial Nerve exam:- CN III-XII intact(No nystagmus), symmetric smile. Strength:- 5/5 equal and symmetric strength both upper and lower extremities.        Assessment & Plan:     Patient Instructions  For you wellness exam today I have ordered cbc, cmp and lipid panel.  Update on shingrix vaccine, flu and covid. Shingrix reported done 2 years ago.  Recommend exercise and healthy diet.  We will let you know lab results as they come in.  Follow up date appointment will be determined after lab review.    Refilled chronic meds today.     Mackie Pai, PA-C

## 2022-11-12 NOTE — Patient Instructions (Addendum)
For you wellness exam today I have ordered cbc, cmp and lipid panel.  Update on shingrix vaccine, flu and covid. Shingrix reported done 2 years ago.  Recommend exercise and healthy diet.  We will let you know lab results as they come in.  Follow up date appointment will be determined after lab review.    Refilled chronic meds today.  Preventive Care 3-53 Years Old, Male Preventive care refers to lifestyle choices and visits with your health care provider that can promote health and wellness. Preventive care visits are also called wellness exams. What can I expect for my preventive care visit? Counseling During your preventive care visit, your health care provider may ask about your: Medical history, including: Past medical problems. Family medical history. Current health, including: Emotional well-being. Home life and relationship well-being. Sexual activity. Lifestyle, including: Alcohol, nicotine or tobacco, and drug use. Access to firearms. Diet, exercise, and sleep habits. Safety issues such as seatbelt and bike helmet use. Sunscreen use. Work and work Statistician. Physical exam Your health care provider will check your: Height and weight. These may be used to calculate your BMI (body mass index). BMI is a measurement that tells if you are at a healthy weight. Waist circumference. This measures the distance around your waistline. This measurement also tells if you are at a healthy weight and may help predict your risk of certain diseases, such as type 2 diabetes and high blood pressure. Heart rate and blood pressure. Body temperature. Skin for abnormal spots. What immunizations do I need?  Vaccines are usually given at various ages, according to a schedule. Your health care provider will recommend vaccines for you based on your age, medical history, and lifestyle or other factors, such as travel or where you work. What tests do I need? Screening Your health care  provider may recommend screening tests for certain conditions. This may include: Lipid and cholesterol levels. Diabetes screening. This is done by checking your blood sugar (glucose) after you have not eaten for a while (fasting). Hepatitis B test. Hepatitis C test. HIV (human immunodeficiency virus) test. STI (sexually transmitted infection) testing, if you are at risk. Lung cancer screening. Prostate cancer screening. Colorectal cancer screening. Talk with your health care provider about your test results, treatment options, and if necessary, the need for more tests. Follow these instructions at home: Eating and drinking  Eat a diet that includes fresh fruits and vegetables, whole grains, lean protein, and low-fat dairy products. Take vitamin and mineral supplements as recommended by your health care provider. Do not drink alcohol if your health care provider tells you not to drink. If you drink alcohol: Limit how much you have to 0-2 drinks a day. Know how much alcohol is in your drink. In the [place] one drink equals one 12 oz bottle of beer (355 mL), one 5 oz glass of wine (148 mL), or one 1 oz glass of hard liquor (44 mL). Lifestyle Brush your teeth every morning and night with fluoride toothpaste. Floss one time each day. Exercise for at least 30 minutes 5 or more days each week. Do not use any products that contain nicotine or tobacco. These products include cigarettes, chewing tobacco, and vaping devices, such as e-cigarettes. If you need help quitting, ask your health care provider. Do not use drugs. If you are sexually active, practice safe sex. Use a condom or other form of protection to prevent STIs. Take aspirin only as told by your health care provider. Make sure that you  understand how much to take and what form to take. Work with your health care provider to find out whether it is safe and beneficial for you to take aspirin daily. Find healthy ways to manage stress, such  as: Meditation, yoga, or listening to music. Journaling. Talking to a trusted person. Spending time with friends and family. Minimize exposure to UV radiation to reduce your risk of skin cancer. Safety Always wear your seat belt while driving or riding in a vehicle. Do not drive: If you have been drinking alcohol. Do not ride with someone who has been drinking. When you are tired or distracted. While texting. If you have been using any mind-altering substances or drugs. Wear a helmet and other protective equipment during sports activities. If you have firearms in your house, make sure you follow all gun safety procedures. What's next? Go to your health care provider once a year for an annual wellness visit. Ask your health care provider how often you should have your eyes and teeth checked. Stay up to date on all vaccines. This information is not intended to replace advice given to you by your health care provider. Make sure you discuss any questions you have with your health care provider. Document Revised: 06/10/2021 Document Reviewed: 06/10/2021 Elsevier Patient Education  [place].

## 2022-11-17 ENCOUNTER — Telehealth: Payer: Self-pay | Admitting: Medical

## 2022-11-17 NOTE — Telephone Encounter (Signed)
Patient notified of results.

## 2022-11-17 NOTE — Telephone Encounter (Signed)
Pt called back to go over labs.

## 2023-04-26 ENCOUNTER — Other Ambulatory Visit: Payer: Self-pay | Admitting: Adult Health

## 2023-04-26 DIAGNOSIS — N138 Other obstructive and reflux uropathy: Secondary | ICD-10-CM

## 2023-05-05 ENCOUNTER — Encounter: Payer: Self-pay | Admitting: Adult Health

## 2023-06-03 ENCOUNTER — Other Ambulatory Visit: Payer: 59

## 2023-07-22 ENCOUNTER — Ambulatory Visit: Payer: 59 | Admitting: Medical

## 2023-07-22 ENCOUNTER — Telehealth: Payer: Self-pay | Admitting: Neurology

## 2023-07-22 VITALS — BP 120/66 | HR 78 | Resp 18 | Ht 68.0 in | Wt 225.0 lb

## 2023-07-22 DIAGNOSIS — R739 Hyperglycemia, unspecified: Secondary | ICD-10-CM | POA: Diagnosis not present

## 2023-07-22 DIAGNOSIS — Z8739 Personal history of other diseases of the musculoskeletal system and connective tissue: Secondary | ICD-10-CM | POA: Diagnosis not present

## 2023-07-22 DIAGNOSIS — R972 Elevated prostate specific antigen [PSA]: Secondary | ICD-10-CM | POA: Diagnosis not present

## 2023-07-22 DIAGNOSIS — I10 Essential (primary) hypertension: Secondary | ICD-10-CM

## 2023-07-22 DIAGNOSIS — E782 Mixed hyperlipidemia: Secondary | ICD-10-CM

## 2023-07-22 DIAGNOSIS — L603 Nail dystrophy: Secondary | ICD-10-CM

## 2023-07-22 LAB — COMPREHENSIVE METABOLIC PANEL
ALT: 26 U/L (ref 0–53)
AST: 19 U/L (ref 0–37)
Albumin: 4.5 g/dL (ref 3.5–5.2)
Alkaline Phosphatase: 67 U/L (ref 39–117)
BUN: 18 mg/dL (ref 6–23)
CO2: 29 mEq/L (ref 19–32)
Calcium: 10.1 mg/dL (ref 8.4–10.5)
Chloride: 102 mEq/L (ref 96–112)
Creatinine, Ser: 1.1 mg/dL (ref 0.40–1.50)
GFR: 76.58 mL/min (ref >60.00)
Glucose, Bld: 110 mg/dL — ABNORMAL HIGH (ref 70–99)
Potassium: 5.3 mEq/L — ABNORMAL HIGH (ref 3.5–5.1)
Sodium: 138 mEq/L (ref 135–145)
Total Bilirubin: 0.5 mg/dL (ref 0.2–1.2)
Total Protein: 6.9 g/dL (ref 6.0–8.3)

## 2023-07-22 LAB — LIPID PANEL
Cholesterol: 192 mg/dL (ref 0–200)
HDL: 50.8 mg/dL (ref >39.00)
LDL Cholesterol: 126 mg/dL — ABNORMAL HIGH (ref 0–99)
NonHDL: 141.49
Total CHOL/HDL Ratio: 4
Triglycerides: 75 mg/dL (ref 0.0–149.0)
VLDL: 15 mg/dL (ref 0.0–40.0)

## 2023-07-22 LAB — PSA: PSA: 4.67 ng/mL — ABNORMAL HIGH (ref 0.10–4.00)

## 2023-07-22 LAB — HEMOGLOBIN A1C: Hgb A1c MFr Bld: 6.1 % (ref 4.6–6.5)

## 2023-07-22 LAB — URIC ACID: Uric Acid, Serum: 4.9 mg/dL (ref 4.0–7.8)

## 2023-07-22 MED ORDER — DOXYCYCLINE HYCLATE 100 MG PO TABS: 100.0000 mg | ORAL_TABLET | Freq: Two times a day (BID) | ORAL | 0 refills | Status: DC

## 2023-07-22 MED ORDER — SODIUM POLYSTYRENE SULFONATE 15 GM/60ML PO SUSP: ORAL | 0 refills | Status: DC

## 2023-07-22 NOTE — Telephone Encounter (Signed)
Reviewed lab results with patient. He states it would be "two weeks" before he could make it here to pick up medication. He asked if you could send to [facility] pharmacy to see if they can get it. From result note:  "Going to send in medication called Kayexalate to the [facility] pharmacy.  You could pick it up today or Monday.  Some of the other local pharmacies do not carry this medication. "

## 2023-07-22 NOTE — Addendum Note (Signed)
Addended by: Gwenevere Abbot on: 07/22/2023 04:38 PM   Modules accepted: Orders

## 2023-07-22 NOTE — Patient Instructions (Addendum)
Hypertension: Well controlled on Losartan 50mg  and Toprol XL 25mg  daily. 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 -Continue current medications. -Check blood pressure regularly, especially 2 weeks prior to DOT physicals. -Practice relaxation techniques during DOT physicals.  Gout: Recent flare 3 weeks ago, managed with discontinuation of Allopurinol and use of Diclofenac twice daily for 7 days. -presently just [place] allopurinol fore prevention  Hyperlipidemia: LDL cholesterol slightly elevated last year, managed with diet. -Order lipid panel today.  Impaired Glucose Tolerance: Occasional elevated blood sugar in the past. -Order HbA1c today.  Elevated PSA: Followed up with urologist, PSA normalized but recommended MRI due to insurance issues. -Follow up with urologist in 2 weeks. -psa today at pt request.  Facial Pain: Tender right cheek area, possibly early skin infection. -Prescribe Doxycycline twice daily for 10 days.  Onychomycosis: Thickened nails for 2-3 years, no change. -Consider KOH study to confirm fungal infection. -If negative refer to derm   follow up date to be determined after lab review. 10 days follow up if face area still tender rt side.

## 2023-07-22 NOTE — Progress Notes (Signed)
Subjective:    Patient ID: Joseph [name], male    DOB: Apr 05, 1971, 52 y.o.   MRN: [id number]  HPI  Discussed the use of AI scribe software for clinical note transcription with the patient, who gave verbal consent to proceed.  History of Present Illness   The patient, with a history of hypertension, gout, hyperlipidemia, and elevated PSA, presents for a routine follow-up. 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  Three weeks prior, he experienced a gout flare, for which he temporarily discontinued Allopurinol and took Diclofenac twice daily for seven days. After the pain resolved, he resumed Allopurinol.  He has been managing his hyperlipidemia with diet alone, but last year's LDL cholesterol was slightly elevated. He also has a history of occasional elevated blood sugar.  His PSA was elevated last year, but a recent follow-up with a urologist showed a return to normal levels. However, he was advised to undergo an MRI due to the PSA still being close to the high end of the normal range. This has been delayed due to insurance issues.  Recently, he has noticed tenderness in the right cheek area, which has been present for two to three days.    He also reports a longstanding issue of nail thickening, present for approximately three years with no significant changes.        Review of Systems     Objective:   Physical Exam  General Mental Status- Alert. General Appearance- Not in acute distress.     Neck Carotid Arteries- Normal color. Moisture- Normal Moisture. No carotid bruits. No JVD.  Chest and Lung Exam Auscultation: Breath Sounds:-Normal.  Cardiovascular Auscultation:Rythm- Regular. Murmurs & Other Heart Sounds:Auscultation of the heart reveals- No Murmurs.    Neurologic Cranial Nerve exam:- CN III-XII intact(No nystagmus), symmetric  smile. Strength:- 5/5 equal and symmetric strength both upper and lower extremities.   Derm- mild thick faint dicolored nails on fingerss. Sample taken today of nail. Rt side face/below zygomatic arch faint pink color to skin area about 1.5 cm wide. Faint tender. No warmth or fluctuane.  Mouth exam- normal. No obvious tooth cavity/normal gums.    Assessment & Plan:   Assessment and Plan    Hypertension: Well controlled on Losartan 50mg  and Toprol XL 25mg  daily. 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 -Continue current medications. -Check blood pressure regularly, especially 2 weeks prior to DOT physicals. -Practice relaxation techniques during DOT physicals.  Gout: Recent flare 3 weeks ago, managed with discontinuation of Allopurinol and use of Diclofenac twice daily for 7 days. -presently just [place] allopurinol fore prevention  Hyperlipidemia: LDL cholesterol slightly elevated last year, managed with diet. -Order lipid panel today.  Impaired Glucose Tolerance: Occasional elevated blood sugar in the past. -Order HbA1c today.  Elevated PSA: Followed up with urologist, PSA normalized but recommended MRI due to insurance issues. -Follow up with urologist in 2 weeks. -psa today at pt request.  Facial Pain: Tender right cheek area, possibly early skin infection. -Prescribe Doxycycline twice daily for 7 days.  Onychomycosis: Thickened nails for 2-3 years, no change. -Consider KOH study to confirm fungal infection. -If negative refer to derm   follow up date to be determined after lab review. 10 days follow up if face area still tender rt side.      Esperanza Richters, PA-C   Correction on antibiotic number. Gave 10 days rx.

## 2023-07-30 NOTE — Addendum Note (Signed)
Addended by: Gwenevere Abbot on: 07/30/2023 06:44 AM   Modules accepted: Orders

## 2023-08-17 ENCOUNTER — Telehealth: Payer: Self-pay | Admitting: Medical

## 2023-08-17 NOTE — Telephone Encounter (Signed)
Pt called & requested to have his referral sent in to another dermatology office that has office hours on Fridays. Please advise pt.

## 2023-08-17 NOTE — Telephone Encounter (Signed)
Spoke with pt made him aware the dermatology office does open on Fridays just hours varies, pt wants an appt with a derm office before Oct if that can't be worked out okay with still seeing the first derm office

## 2023-10-28 ENCOUNTER — Ambulatory Visit (INDEPENDENT_AMBULATORY_CARE_PROVIDER_SITE_OTHER): Payer: 59 | Admitting: Medical

## 2023-10-28 VITALS — BP 128/64 | HR 77 | Temp 98.0°F | Resp 18 | Ht 68.0 in | Wt 220.0 lb

## 2023-10-28 DIAGNOSIS — Z113 Encounter for screening for infections with a predominantly sexual mode of transmission: Secondary | ICD-10-CM | POA: Diagnosis not present

## 2023-10-28 DIAGNOSIS — R972 Elevated prostate specific antigen [PSA]: Secondary | ICD-10-CM

## 2023-10-28 DIAGNOSIS — Z0000 Encounter for general adult medical examination without abnormal findings: Secondary | ICD-10-CM

## 2023-10-28 DIAGNOSIS — Z13220 Encounter for screening for lipoid disorders: Secondary | ICD-10-CM | POA: Diagnosis not present

## 2023-10-28 DIAGNOSIS — E875 Hyperkalemia: Secondary | ICD-10-CM | POA: Diagnosis not present

## 2023-10-28 DIAGNOSIS — D649 Anemia, unspecified: Secondary | ICD-10-CM

## 2023-10-28 LAB — COMPREHENSIVE METABOLIC PANEL
ALT: 18 U/L (ref 0–53)
AST: 17 U/L (ref 0–37)
Albumin: 4.3 g/dL (ref 3.5–5.2)
Alkaline Phosphatase: 66 U/L (ref 39–117)
BUN: 16 mg/dL (ref 6–23)
CO2: 28 meq/L (ref 19–32)
Calcium: 9.7 mg/dL (ref 8.4–10.5)
Chloride: 103 meq/L (ref 96–112)
Creatinine, Ser: 1.12 mg/dL (ref 0.40–1.50)
GFR: 74.8 mL/min (ref >60.00)
Glucose, Bld: 104 mg/dL — ABNORMAL HIGH (ref 70–99)
Potassium: 5.3 meq/L — ABNORMAL HIGH (ref 3.5–5.1)
Sodium: 138 meq/L (ref 135–145)
Total Bilirubin: 0.6 mg/dL (ref 0.2–1.2)
Total Protein: 6.8 g/dL (ref 6.0–8.3)

## 2023-10-28 LAB — LIPID PANEL
Cholesterol: 164 mg/dL (ref 0–200)
HDL: 48.2 mg/dL (ref >39.00)
LDL Cholesterol: 98 mg/dL (ref 0–99)
NonHDL: 116.23
Total CHOL/HDL Ratio: 3
Triglycerides: 89 mg/dL (ref 0.0–149.0)
VLDL: 17.8 mg/dL (ref 0.0–40.0)

## 2023-10-28 LAB — CBC WITH DIFFERENTIAL/PLATELET
Basophils Absolute: 0.1 10*3/uL (ref 0.0–0.1)
Basophils Relative: 0.8 % (ref 0.0–3.0)
Eosinophils Absolute: 0.4 10*3/uL (ref 0.0–0.7)
Eosinophils Relative: 4.8 % (ref 0.0–5.0)
HCT: 40.4 % (ref 39.0–52.0)
Hemoglobin: 12.4 g/dL — ABNORMAL LOW (ref 13.0–17.0)
Lymphocytes Relative: 26 % (ref 12.0–46.0)
Lymphs Abs: 2.3 10*3/uL (ref 0.7–4.0)
MCHC: 30.7 g/dL (ref 30.0–36.0)
MCV: 81.3 fL (ref 78.0–100.0)
Monocytes Absolute: 0.6 10*3/uL (ref 0.1–1.0)
Monocytes Relative: 7 % (ref 3.0–12.0)
Neutro Abs: 5.5 10*3/uL (ref 1.4–7.7)
Neutrophils Relative %: 61.4 % (ref 43.0–77.0)
Platelets: 264 10*3/uL (ref 150.0–400.0)
RBC: 4.97 Mil/uL (ref 4.22–5.81)
RDW: 15.5 % (ref 11.5–15.5)
WBC: 8.9 10*3/uL (ref 4.0–10.5)

## 2023-10-28 LAB — PSA: PSA: 4.91 ng/mL — ABNORMAL HIGH (ref 0.10–4.00)

## 2023-10-28 NOTE — Progress Notes (Signed)
Subjective:    Patient ID: Joseph [name], male    DOB: November 21, 1971, 52 y.o.   MRN: [id number]  HPI  Pt in for cpe/wellness.   Pt has not been exercising. He has been trying to eat healthy.  Non smoker. Drink 3 cups of coffee a day.    Up to date on colonoscopy. He states done 2020 and was normal.   Psa recently done by urologist and told no concerns per pt.   Shingrix vaccine. States both done in [facility] per pt   He states had flu vaccine and covid this fall already.    Review of Systems  Constitutional:  Negative for chills, fatigue and fever.  HENT:  Negative for dental problem.   Respiratory:  Negative for cough, chest tightness, shortness of breath and wheezing.   Gastrointestinal:  Negative for abdominal pain.  Genitourinary:  Negative for enuresis, frequency and hematuria.  Musculoskeletal:  Negative for back pain, joint swelling and myalgias.  Skin:  Negative for rash.  Neurological:  Negative for dizziness, speech difficulty, weakness, numbness and headaches.  Hematological:  Negative for adenopathy. Does not bruise/bleed easily.  Psychiatric/Behavioral:  Negative for behavioral problems and dysphoric mood. The patient is not nervous/anxious and is not hyperactive.    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   Glaucoma 06/24/2021   BOTH EYES   Gout    NO RECENT FLARES AS OF 06-25-2021   History of degenerative disc disease 03/24/2017   History of kidney stones 03/24/2017   HTN (hypertension) 10/10/2017   Lipoma 03/24/2017   Tinnitus 03/24/2017   Wears glasses 06/24/2021     Social History   Socioeconomic History   Marital status: Single    Spouse name: Not on file   Number of children: Not on file   Years of education: Not on file   Highest education level: Associate degree: occupational, Scientist, product/process development, or vocational program  Occupational History   Not on file  Tobacco Use   Smoking status:  Never   Smokeless tobacco: Never  Vaping Use   Vaping status: Never Used  Substance and Sexual Activity   Alcohol use: Yes    Alcohol/week: 2.0 standard drinks of alcohol    Types: 2 Cans of beer per week    Comment: OCC WINE OR BEER   Drug use: No   Sexual activity: Not on file  Other Topics Concern   Not on file  Social History Narrative   Works as Optician, dispensing, no dietary restrictions.   Social Determinants of Health   Financial Resource Strain: Medium Risk (07/16/2023)   Overall Financial Resource Strain (CARDIA)    Difficulty of Paying Living Expenses: Somewhat hard  Food Insecurity: Unknown (07/16/2023)   Hunger Vital Sign    Worried About Running Out of Food in the Last Year: Not on file    Ran Out of Food in the Last Year: Never true  Transportation Needs: No Transportation Needs (07/16/2023)   PRAPARE - Administrator, Civil Service (Medical): No    Lack of Transportation (Non-Medical): No  Physical Activity: Unknown (07/16/2023)   Exercise Vital Sign    Days of Exercise per Week: 0 days    Minutes of Exercise per Session: Not on file  Stress: No Stress Concern Present (07/16/2023)   [employer] of Occupational Health - Occupational Stress Questionnaire    Feeling of Stress : Only a  little  Social Connections: Socially Isolated (07/16/2023)   Social Connection and Isolation Panel [NHANES]    Frequency of Communication with Friends and Family: More than three times a week    Frequency of Social Gatherings with Friends and Family: Once a week    Attends Religious Services: Never    Database administrator or Organizations: No    Attends Engineer, structural: Not on file    Marital Status: Divorced  Intimate Partner Violence: Unknown (03/31/2022)   Received from [employer] [facility], [facility]   HITS    Physically Hurt: Not on file    Insult or Talk Down To: Not on file    Threaten Physical Harm: Not on file    Scream or Curse:  Not on file    Past Surgical History:  Procedure Laterality Date   APPENDECTOMY     AGE 49   CYSTOSCOPY WITH RETROGRADE PYELOGRAM, URETEROSCOPY AND STENT PLACEMENT Right 06/26/2021   Procedure: CYSTOSCOPY WITH RIGHT RETROGRADE / RIGHT URETERAL DILATION/ URETEROSCOPY HOLMIUM LASER AND STENT PLACEMENT;  Surgeon: Bjorn Pippin, MD;  Location: [facility];  Service: Urology;  Laterality: Right;    Family History  Problem Relation Age of Onset   Diabetes Mother    Glaucoma Father    Hypertension Father     No Known Allergies  Current Outpatient Medications on File Prior to Visit  Medication Sig Dispense Refill   allopurinol (ZYLOPRIM) 300 MG tablet Take 1 tablet (300 mg total) by mouth daily. 90 tablet 3   diclofenac (VOLTAREN) 75 MG EC tablet TAKE ONE TABLET BY MOUTH TWICE DAILY *used gout flare and take with food* (Patient taking differently: as needed.) 180 tablet 0   doxycycline (VIBRA-TABS) 100 MG tablet Take 1 tablet (100 mg total) by mouth 2 (two) times daily. 20 tablet 0   latanoprost (XALATAN) 0.005 % ophthalmic solution 1 drop at bedtime.     losartan (COZAAR) 50 MG tablet Take 1 tablet (50 mg total) by mouth daily. 90 tablet 3   metoprolol succinate (TOPROL-XL) 25 MG 24 hr tablet Take 1 tablet (25 mg total) by mouth daily. 90 tablet 3   VITAMIN D PO Take by mouth. 2000 UNITS VITAMIN D QOD     No current facility-administered medications on file prior to visit.    BP 128/64   Pulse 77   Temp 98 F (36.7 C)   Resp 18   Ht 5\' 8"  (1.727 m)   Wt 220 lb (99.8 kg)   SpO2 100%   BMI 33.45 kg/m         Objective:   Physical Exam  General Mental Status- Alert. General Appearance- Not in acute distress.   Skin General: Color- Normal Color. Moisture- Normal Moisture.  Neck Carotid Arteries- Normal color. Moisture- Normal Moisture. No carotid bruits. No JVD.  Chest and Lung Exam Auscultation: Breath  Sounds:-Normal.  Cardiovascular Auscultation:Rythm- Regular. Murmurs & Other Heart Sounds:Auscultation of the heart reveals- No Murmurs.  Abdomen Inspection:-Inspeection Normal. Palpation/Percussion:Note:No mass. Palpation and Percussion of the abdomen reveal- Non Tender, Non Distended + BS, no rebound or guarding.   Neurologic Cranial Nerve exam:- CN III-XII intact(No nystagmus), symmetric smile. Strength:- 5/5 equal and symmetric strength both upper and lower extremities.       Assessment & Plan:   Patient Instructions  For you wellness exam today I have ordered cbc, cmp, psa, hiv and  lipid panel.  Vaccine up to date. Shingrix done thu pharmacy per your report.  Flu and covid done this year.  Recommend exercise and healthy diet.  We will let you know lab results as they come in.  Follow up date appointment will be determined after lab review.       Esperanza Richters, PA-C

## 2023-10-28 NOTE — Patient Instructions (Addendum)
For you wellness exam today I have ordered cbc, cmp, psa, hiv and  lipid panel.  Vaccine up to date. Shingrix done thu pharmacy per your report. Flu and covid done this year.  Recommend exercise and healthy diet.  We will let you know lab results as they come in.  Follow up date appointment will be determined after lab review.        Preventive Care 20-54 Years Old, Male Preventive care refers to lifestyle choices and visits with your health care provider that can promote health and wellness. Preventive care visits are also called wellness exams. What can I expect for my preventive care visit? Counseling During your preventive care visit, your health care provider may ask about your: Medical history, including: Past medical problems. Family medical history. Current health, including: Emotional well-being. Home life and relationship well-being. Sexual activity. Lifestyle, including: Alcohol, nicotine or tobacco, and drug use. Access to firearms. Diet, exercise, and sleep habits. Safety issues such as seatbelt and bike helmet use. Sunscreen use. Work and work Astronomer. Physical exam Your health care provider will check your: Height and weight. These may be used to calculate your BMI (body mass index). BMI is a measurement that tells if you are at a healthy weight. Waist circumference. This measures the distance around your waistline. This measurement also tells if you are at a healthy weight and may help predict your risk of certain diseases, such as type 2 diabetes and high blood pressure. Heart rate and blood pressure. Body temperature. Skin for abnormal spots. What immunizations do I need?  Vaccines are usually given at various ages, according to a schedule. Your health care provider will recommend vaccines for you based on your age, medical history, and lifestyle or other factors, such as travel or where you work. What tests do I need? Screening Your health care  provider may recommend screening tests for certain conditions. This may include: Lipid and cholesterol levels. Diabetes screening. This is done by checking your blood sugar (glucose) after you have not eaten for a while (fasting). Hepatitis B test. Hepatitis C test. HIV (human immunodeficiency virus) test. STI (sexually transmitted infection) testing, if you are at risk. Lung cancer screening. Prostate cancer screening. Colorectal cancer screening. Talk with your health care provider about your test results, treatment options, and if necessary, the need for more tests. Follow these instructions at home: Eating and drinking  Eat a diet that includes fresh fruits and vegetables, whole grains, lean protein, and low-fat dairy products. Take vitamin and mineral supplements as recommended by your health care provider. Do not drink alcohol if your health care provider tells you not to drink. If you drink alcohol: Limit how much you have to 0-2 drinks a day. Know how much alcohol is in your drink. In the [place] one drink equals one 12 oz bottle of beer (355 mL), one 5 oz glass of wine (148 mL), or one 1 oz glass of hard liquor (44 mL). Lifestyle Brush your teeth every morning and night with fluoride toothpaste. Floss one time each day. Exercise for at least 30 minutes 5 or more days each week. Do not use any products that contain nicotine or tobacco. These products include cigarettes, chewing tobacco, and vaping devices, such as e-cigarettes. If you need help quitting, ask your health care provider. Do not use drugs. If you are sexually active, practice safe sex. Use a condom or other form of protection to prevent STIs. Take aspirin only as told by your health  care provider. Make sure that you understand how much to take and what form to take. Work with your health care provider to find out whether it is safe and beneficial for you to take aspirin daily. Find healthy ways to manage stress, such  as: Meditation, yoga, or listening to music. Journaling. Talking to a trusted person. Spending time with friends and family. Minimize exposure to UV radiation to reduce your risk of skin cancer. Safety Always wear your seat belt while driving or riding in a vehicle. Do not drive: If you have been drinking alcohol. Do not ride with someone who has been drinking. When you are tired or distracted. While texting. If you have been using any mind-altering substances or drugs. Wear a helmet and other protective equipment during sports activities. If you have firearms in your house, make sure you follow all gun safety procedures. What's next? Go to your health care provider once a year for an annual wellness visit. Ask your health care provider how often you should have your eyes and teeth checked. Stay up to date on all vaccines. This information is not intended to replace advice given to you by your health care provider. Make sure you discuss any questions you have with your health care provider. Document Revised: 06/10/2021 Document Reviewed: 06/10/2021 Elsevier Patient Education  2024 [employer].

## 2023-10-29 ENCOUNTER — Encounter: Payer: Self-pay | Admitting: Medical

## 2023-10-29 LAB — HIV ANTIBODY (ROUTINE TESTING W REFLEX): HIV 1&2 Ab, 4th Generation: NONREACTIVE

## 2023-10-29 MED ORDER — SODIUM POLYSTYRENE SULFONATE 15 GM/60ML PO SUSP: ORAL | 0 refills | Status: DC

## 2023-10-29 NOTE — Addendum Note (Signed)
Addended by: Gwenevere Abbot on: 10/29/2023 04:31 AM   Modules accepted: Orders

## 2023-10-31 ENCOUNTER — Other Ambulatory Visit (HOSPITAL_BASED_OUTPATIENT_CLINIC_OR_DEPARTMENT_OTHER): Payer: Self-pay

## 2023-10-31 ENCOUNTER — Other Ambulatory Visit: Payer: Self-pay

## 2023-10-31 MED ORDER — SODIUM POLYSTYRENE SULFONATE 15 GM/60ML PO SUSP
60.0000 g | Freq: Every day | ORAL | 0 refills | Status: DC
  Filled 2023-10-31: qty 240, 4d supply, fill #0

## 2023-10-31 MED ORDER — SODIUM POLYSTYRENE SULFONATE 15 GM/60ML PO SUSP: 60.0000 g | Freq: Every day | ORAL | 0 refills | Status: DC

## 2023-10-31 NOTE — Telephone Encounter (Signed)
Pt called back to advise he could not get in his mychart at the moment and wanted to know what it said. Advised pt of what message said since no clinical staff was available. Pt said that [facility] pharmacy is not open on Saturdays so he wants to know if provider could call in something else to [facility]. Please call to advise.

## 2023-10-31 NOTE — Addendum Note (Signed)
Addended by: Gwenevere Abbot on: 10/31/2023 01:06 PM   Modules accepted: Orders

## 2023-10-31 NOTE — Telephone Encounter (Signed)
Rx sent to [facility]

## 2023-10-31 NOTE — Telephone Encounter (Signed)
Pt also wanted to mention that sodium polystyrene (KAYEXALATE) 15 GM/60ML suspension manufacturer changed and [facility] needs this resent.    [id number] - [place], [place] - [address] [place] [address] Phone: [phone]  Fax: [phone]

## 2024-02-04 ENCOUNTER — Other Ambulatory Visit: Payer: Self-pay | Admitting: Medical

## 2024-04-20 ENCOUNTER — Other Ambulatory Visit: Payer: Self-pay | Admitting: Medical

## 2024-07-28 ENCOUNTER — Other Ambulatory Visit: Payer: Self-pay | Admitting: Medical

## 2024-08-01 ENCOUNTER — Telehealth: Payer: Self-pay

## 2024-08-01 NOTE — Telephone Encounter (Signed)
 Spoke to patient an asked him about Urology referral, he isn't sure why he had one and didn't plan on going. Let him know we will close it and if he needs it opened he can reach back out to us .

## 2024-10-22 DIAGNOSIS — S6990XA Unspecified injury of unspecified wrist, hand and finger(s), initial encounter: Secondary | ICD-10-CM | POA: Insufficient documentation

## 2024-10-22 DIAGNOSIS — M79645 Pain in left finger(s): Secondary | ICD-10-CM | POA: Insufficient documentation

## 2024-10-27 ENCOUNTER — Other Ambulatory Visit: Payer: Self-pay | Admitting: Medical

## 2024-11-09 ENCOUNTER — Ambulatory Visit: Admitting: Medical

## 2024-11-09 VITALS — BP 124/70 | HR 79 | Temp 97.6°F | Resp 16 | Ht 68.0 in | Wt 217.2 lb

## 2024-11-09 DIAGNOSIS — I10 Essential (primary) hypertension: Secondary | ICD-10-CM | POA: Diagnosis not present

## 2024-11-09 DIAGNOSIS — Z13220 Encounter for screening for lipoid disorders: Secondary | ICD-10-CM

## 2024-11-09 DIAGNOSIS — Z125 Encounter for screening for malignant neoplasm of prostate: Secondary | ICD-10-CM

## 2024-11-09 DIAGNOSIS — Z0000 Encounter for general adult medical examination without abnormal findings: Secondary | ICD-10-CM

## 2024-11-09 DIAGNOSIS — E875 Hyperkalemia: Secondary | ICD-10-CM

## 2024-11-09 DIAGNOSIS — R011 Cardiac murmur, unspecified: Secondary | ICD-10-CM

## 2024-11-09 DIAGNOSIS — Z1283 Encounter for screening for malignant neoplasm of skin: Secondary | ICD-10-CM

## 2024-11-09 LAB — COMPREHENSIVE METABOLIC PANEL WITH GFR
ALT: 15 U/L (ref 0–53)
AST: 16 U/L (ref 0–37)
Albumin: 4.5 g/dL (ref 3.5–5.2)
Alkaline Phosphatase: 57 U/L (ref 39–117)
BUN: 16 mg/dL (ref 6–23)
CO2: 30 meq/L (ref 19–32)
Calcium: 9.7 mg/dL (ref 8.4–10.5)
Chloride: 103 meq/L (ref 96–112)
Creatinine, Ser: 0.99 mg/dL (ref 0.40–1.50)
GFR: 86.11 mL/min (ref >60.00)
Glucose, Bld: 95 mg/dL (ref 70–99)
Potassium: 5.2 meq/L — ABNORMAL HIGH (ref 3.5–5.1)
Sodium: 138 meq/L (ref 135–145)
Total Bilirubin: 0.7 mg/dL (ref 0.2–1.2)
Total Protein: 6.9 g/dL (ref 6.0–8.3)

## 2024-11-09 LAB — LIPID PANEL
Cholesterol: 188 mg/dL (ref 0–200)
HDL: 50.3 mg/dL (ref >39.00)
LDL Cholesterol: 119 mg/dL — ABNORMAL HIGH (ref 0–99)
NonHDL: 137.37
Total CHOL/HDL Ratio: 4
Triglycerides: 94 mg/dL (ref 0.0–149.0)
VLDL: 18.8 mg/dL (ref 0.0–40.0)

## 2024-11-09 LAB — CBC WITH DIFFERENTIAL/PLATELET
Basophils Absolute: 0.1 K/uL (ref 0.0–0.1)
Basophils Relative: 0.7 % (ref 0.0–3.0)
Eosinophils Absolute: 0.3 K/uL (ref 0.0–0.7)
Eosinophils Relative: 3.4 % (ref 0.0–5.0)
HCT: 36.8 % — ABNORMAL LOW (ref 39.0–52.0)
Hemoglobin: 11.7 g/dL — ABNORMAL LOW (ref 13.0–17.0)
Lymphocytes Relative: 23.8 % (ref 12.0–46.0)
Lymphs Abs: 1.9 K/uL (ref 0.7–4.0)
MCHC: 31.6 g/dL (ref 30.0–36.0)
MCV: 73.6 fl — ABNORMAL LOW (ref 78.0–100.0)
Monocytes Absolute: 0.5 K/uL (ref 0.1–1.0)
Monocytes Relative: 6.4 % (ref 3.0–12.0)
Neutro Abs: 5.4 K/uL (ref 1.4–7.7)
Neutrophils Relative %: 65.7 % (ref 43.0–77.0)
Platelets: 271 K/uL (ref 150.0–400.0)
RBC: 5 Mil/uL (ref 4.22–5.81)
RDW: 16.7 % — ABNORMAL HIGH (ref 11.5–15.5)
WBC: 8.2 K/uL (ref 4.0–10.5)

## 2024-11-09 LAB — PSA: PSA: 3.91 ng/mL (ref 0.10–4.00)

## 2024-11-09 NOTE — Patient Instructions (Addendum)
 For you wellness exam today I have ordered cbc, cmp, psa and  lipid panel.  Vaccine up to date.   -up to date on colonoscopy  Recommend exercise and healthy diet.  We will let you know lab results as they come in.  Follow up date appointment will be determined after lab review.      Skin cancer screening - Ambulatory referral to Dermatology  Murmur -low level I-II/VI. Offered echo to confirm vs caridiology referral. Pt chose referral. - Ambulatory referral to Cardiology  Preventive Care 48-71 Years Old, Male Preventive care refers to lifestyle choices and visits with your health care provider that can promote health and wellness. Preventive care visits are also called wellness exams. What can I expect for my preventive care visit? Counseling During your preventive care visit, your health care provider may ask about your: Medical history, including: Past medical problems. Family medical history. Current health, including: Emotional well-being. Home life and relationship well-being. Sexual activity. Lifestyle, including: Alcohol, nicotine or tobacco, and drug use. Access to firearms. Diet, exercise, and sleep habits. Safety issues such as seatbelt and bike helmet use. Sunscreen use. Work and work astronomer. Physical exam Your health care provider will check your: Height and weight. These may be used to calculate your BMI (body mass index). BMI is a measurement that tells if you are at a healthy weight. Waist circumference. This measures the distance around your waistline. This measurement also tells if you are at a healthy weight and may help predict your risk of certain diseases, such as type 2 diabetes and high blood pressure. Heart rate and blood pressure. Body temperature. Skin for abnormal spots. What immunizations do I need?  Vaccines are usually given at various ages, according to a schedule. Your health care provider will recommend vaccines for you based on  your age, medical history, and lifestyle or other factors, such as travel or where you work. What tests do I need? Screening Your health care provider may recommend screening tests for certain conditions. This may include: Lipid and cholesterol levels. Diabetes screening. This is done by checking your blood sugar (glucose) after you have not eaten for a while (fasting). Hepatitis B test. Hepatitis C test. HIV (human immunodeficiency virus) test. STI (sexually transmitted infection) testing, if you are at risk. Lung cancer screening. Prostate cancer screening. Colorectal cancer screening. Talk with your health care provider about your test results, treatment options, and if necessary, the need for more tests. Follow these instructions at home: Eating and drinking  Eat a diet that includes fresh fruits and vegetables, whole grains, lean protein, and low-fat dairy products. Take vitamin and mineral supplements as recommended by your health care provider. Do not drink alcohol if your health care provider tells you not to drink. If you drink alcohol: Limit how much you have to 0-2 drinks a day. Know how much alcohol is in your drink. In the [place] one drink equals one 12 oz bottle of beer (355 mL), one 5 oz glass of wine (148 mL), or one 1 oz glass of hard liquor (44 mL). Lifestyle Brush your teeth every morning and night with fluoride toothpaste. Floss one time each day. Exercise for at least 30 minutes 5 or more days each week. Do not use any products that contain nicotine or tobacco. These products include cigarettes, chewing tobacco, and vaping devices, such as e-cigarettes. If you need help quitting, ask your health care provider. Do not use drugs. If you are sexually active, practice safe  sex. Use a condom or other form of protection to prevent STIs. Take aspirin only as told by your health care provider. Make sure that you understand how much to take and what form to take. Work with  your health care provider to find out whether it is safe and beneficial for you to take aspirin daily. Find healthy ways to manage stress, such as: Meditation, yoga, or listening to music. Journaling. Talking to a trusted person. Spending time with friends and family. Minimize exposure to UV radiation to reduce your risk of skin cancer. Safety Always wear your seat belt while driving or riding in a vehicle. Do not drive: If you have been drinking alcohol. Do not ride with someone who has been drinking. When you are tired or distracted. While texting. If you have been using any mind-altering substances or drugs. Wear a helmet and other protective equipment during sports activities. If you have firearms in your house, make sure you follow all gun safety procedures. What's next? Go to your health care provider once a year for an annual wellness visit. Ask your health care provider how often you should have your eyes and teeth checked. Stay up to date on all vaccines. This information is not intended to replace advice given to you by your health care provider. Make sure you discuss any questions you have with your health care provider. Document Revised: 06/10/2021 Document Reviewed: 06/10/2021 Elsevier Patient Education  2024 [employer].

## 2024-11-09 NOTE — Addendum Note (Signed)
 Addended by: DORINA DALLAS HERO on: 11/09/2024 09:20 AM   Modules accepted: Orders

## 2024-11-09 NOTE — Progress Notes (Signed)
 Subjective:    Patient ID: Joseph [name], male    DOB: 06-10-1971, 53 y.o.   MRN: [id number]  HPI   Pt in for cpe/wellness. Pt is fasting.   Pt has not been exercising. He has been trying to eat healthy.  Non smoker. Drink 3 cups of coffee a day.    Up to date on colonoscopy. He states done 2020 and was normal.   Psa recheck today with wellness exam labs.    Up to date on vaccine. Recent pneumonia, shingrix, covid and flu vacccines.     Pt told had murmur at [employer]. Pt states never had that before. No symptoms. Pt expresses he does not want to be charge for this complaint nor do echo due to concern for cost.    Review of Systems  Constitutional:  Negative for chills, fatigue and fever.  HENT:  Negative for dental problem.   Respiratory:  Negative for cough, chest tightness, shortness of breath and wheezing.   Gastrointestinal:  Negative for abdominal pain.  Genitourinary:  Negative for enuresis, frequency and hematuria.  Musculoskeletal:  Negative for back pain, joint swelling and myalgias.  Skin:  Negative for rash.  Neurological:  Negative for dizziness, speech difficulty, weakness, numbness and headaches.  Hematological:  Negative for adenopathy. Does not bruise/bleed easily.  Psychiatric/Behavioral:  Negative for behavioral problems and dysphoric mood. The patient is not nervous/anxious and is not hyperactive.      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   Glaucoma 06/24/2021   BOTH EYES   Gout    NO RECENT FLARES AS OF 06-25-2021   History of degenerative disc disease 03/24/2017   History of kidney stones 03/24/2017   HTN (hypertension) 10/10/2017   Lipoma 03/24/2017   Tinnitus 03/24/2017   Wears glasses 06/24/2021     Social History   Socioeconomic History   Marital status: Divorced    Spouse name: Not on file   Number of children: Not on file   Years of education: Not on file   Highest  education level: Associate degree: occupational, scientist, product/process development, or vocational program  Occupational History   Not on file  Tobacco Use   Smoking status: Never   Smokeless tobacco: Never  Vaping Use   Vaping status: Never Used  Substance and Sexual Activity   Alcohol use: Yes    Alcohol/week: 2.0 standard drinks of alcohol    Types: 2 Cans of beer per week    Comment: OCC WINE OR BEER   Drug use: No   Sexual activity: Not on file  Other Topics Concern   Not on file  Social History Narrative   Works as optician, dispensing, no dietary restrictions.   Social Drivers of Corporate Investment Banker Strain: Low Risk  (11/02/2024)   Overall Financial Resource Strain (CARDIA)    Difficulty of Paying Living Expenses: Not very hard  Food Insecurity: No Food Insecurity (11/02/2024)   Hunger Vital Sign    Worried About Running Out of Food in the Last Year: Never true    Ran Out of Food in the Last Year: Never true  Transportation Needs: No Transportation Needs (11/02/2024)   PRAPARE - Administrator, Civil Service (Medical): No    Lack of Transportation (Non-Medical): No  Physical Activity: Inactive (11/02/2024)   Exercise Vital Sign    Days of Exercise per Week: 0 days  Minutes of Exercise per Session: Not on file  Stress: Stress Concern Present (11/02/2024)   [employer] of Occupational Health - Occupational Stress Questionnaire    Feeling of Stress: To some extent  Social Connections: Socially Isolated (11/02/2024)   Social Connection and Isolation Panel    Frequency of Communication with Friends and Family: More than three times a week    Frequency of Social Gatherings with Friends and Family: Never    Attends Religious Services: Never    Database Administrator or Organizations: No    Attends Engineer, Structural: Not on file    Marital Status: Divorced  Intimate Partner Violence: Unknown (03/31/2022)   Received from [facility]   HITS     Physically Hurt: Not on file    Insult or Talk Down To: Not on file    Threaten Physical Harm: Not on file    Scream or Curse: Not on file    Past Surgical History:  Procedure Laterality Date   APPENDECTOMY     AGE 1   CYSTOSCOPY WITH RETROGRADE PYELOGRAM, URETEROSCOPY AND STENT PLACEMENT Right 06/26/2021   Procedure: CYSTOSCOPY WITH RIGHT RETROGRADE / RIGHT URETERAL DILATION/ URETEROSCOPY HOLMIUM LASER AND STENT PLACEMENT;  Surgeon: Watt Rush, MD;  Location: [facility];  Service: Urology;  Laterality: Right;    Family History  Problem Relation Age of Onset   Diabetes Mother    Glaucoma Father    Hypertension Father     No Known Allergies  Current Outpatient Medications on File Prior to Visit  Medication Sig Dispense Refill   allopurinol  (ZYLOPRIM ) 300 MG tablet TAKE ONE TABLET BY MOUTH ONE TIME DAILY 90 tablet 0   diclofenac  (VOLTAREN ) 75 MG EC tablet TAKE ONE TABLET BY MOUTH TWICE DAILY *used gout flare and take with food* (Patient taking differently: as needed.) 180 tablet 0   doxycycline  (VIBRA -TABS) 100 MG tablet Take 1 tablet (100 mg total) by mouth 2 (two) times daily. 20 tablet 0   latanoprost (XALATAN) 0.005 % ophthalmic solution 1 drop at bedtime.     losartan  (COZAAR ) 50 MG tablet TAKE ONE TABLET BY MOUTH ONE TIME DAILY 90 tablet 0   metoprolol  succinate (TOPROL -XL) 25 MG 24 hr tablet TAKE ONE TABLET BY MOUTH ONCE A DAY 90 tablet 0   sodium polystyrene (KAYEXALATE ) 15 GM/60ML suspension Take 240 mLs (60 g total) by mouth daily. 240 mL 0   VITAMIN D PO Take by mouth. 2000 UNITS VITAMIN D QOD     No current facility-administered medications on file prior to visit.    BP 124/70   Pulse 79   Temp 97.6 F (36.4 C) (Oral)   Resp 16   Ht 5' 8 (1.727 m)   Wt 217 lb 3.2 oz (98.5 kg)   SpO2 99%   BMI 33.03 kg/m         Objective:   Physical Exam  General Mental Status- Alert. General Appearance- Not in acute distress.   Skin General: Color-  Normal Color. Moisture- Normal Moisture.  Neck Carotid Arteries- Normal color. Moisture- Normal Moisture. No carotid bruits. No JVD.  Chest and Lung Exam Auscultation: Breath Sounds:-CTA  Cardiovascular Auscultation:Rythm- RRR(on close auscultaton I do think I hear murmur I-II/VI   Abdomen Inspection:-Inspeection Normal. Palpation/Percussion:Note:No mass. Palpation and Percussion of the abdomen reveal- Non Tender, Non Distended + BS, no rebound or guarding.   Neurologic Cranial Nerve exam:- CN III-XII intact(No nystagmus), symmetric smile. tStrength:- 5/5 equal and symmetric strength  both upper and lower extremities.       Assessment & Plan:   Patient Instructions  For you wellness exam today I have ordered cbc, cmp, psa and  lipid panel.  Vaccine up to date.   -up to date on colonoscopy  Recommend exercise and healthy diet.  We will let you know lab results as they come in.  Follow up date appointment will be determined after lab review.      Skin cancer screening - Ambulatory referral to Dermatology  Murmur -low level I-II/VI. Offered echo to confirm vs caridiology referral. Pt chose referral. - Ambulatory referral to Cardiology     Dallas Maxwell, PA-C

## 2024-11-10 ENCOUNTER — Ambulatory Visit: Payer: Self-pay | Admitting: Medical

## 2024-11-10 MED ORDER — SODIUM POLYSTYRENE SULFONATE 15 GM/60ML CO SUSP: 0 refills | Status: DC

## 2024-11-10 NOTE — Addendum Note (Signed)
 Addended by: DORINA DALLAS HERO on: 11/10/2024 10:26 AM   Modules accepted: Orders

## 2024-11-19 ENCOUNTER — Ambulatory Visit: Payer: Self-pay

## 2024-11-19 NOTE — Telephone Encounter (Signed)
 Please advise- when should Pt's potassium levels be rechecked?

## 2024-11-19 NOTE — Telephone Encounter (Signed)
 FYI Only or Action Required?: Action required by provider: clinical question for provider.  Patient was last seen in primary care on 11/09/2024 by Dorina Loving, PA-C.  Called Nurse Triage reporting Medication Adherence.   Triage Disposition: Call PCP When Office is Open  Patient/caregiver understands and will follow disposition?: Yes         Copied from CRM #[id number]. Topic: Clinical - Medication Question >> Nov 19, 2024 12:31 PM Pinkey ORN wrote: Reason for CRM: sodium polystyrene (KAYEXALATE ) 15 GM/60ML suspension >> Nov 19, 2024 12:32 PM Pinkey ORN wrote: Patient called wanting to know how often is he supposed to take his medication.  Reason for Disposition  [1] Caller requesting NON-URGENT health information AND [2] PCP's office is the best resource  Answer Assessment - Initial Assessment Questions 1. REASON FOR CALL: What is the main reason for your call? or How can I best help you?   Patient called wanting to know how often is he supposed to take his sodium polystyrene medication. He was advised 60ml, for the course of 4 days, no other concerns/questions at this time regarding the medications, however he wants to know when he should have repeat labs done. Please advise.  Protocols used: Information Only Call - No Triage-A-AH

## 2024-11-19 NOTE — Telephone Encounter (Signed)
 Please review result note as this explained when he should repeat lab. He should repeat tomorrow or on wed as that would be about 10 days. See below copied from that result note. Future lab is already ordered so everything should already should be set.   Will send med called kayexalate  to bring your potassium level down. Then want you to repeat potassium in 10 days

## 2024-11-20 NOTE — Telephone Encounter (Signed)
 LMOM asking for call back. Okay to relay on call back. Needs lab appt today or tomorrow, CMP in.

## 2024-11-21 NOTE — Telephone Encounter (Signed)
 Joseph [name], [name]   11/21/2024 10:06 AM  Patient is returning a call from the office. He acknowledged his providers response and is scheduled for labs on December 2nd since he is not available this week.

## 2024-11-23 ENCOUNTER — Encounter: Payer: Self-pay | Admitting: Medical

## 2024-11-26 NOTE — Telephone Encounter (Signed)
 Initial Comment Caller states he needs to speak to the office about an upcoming appointment. Translation No Disp. Time Titus Time) Disposition Final User 11/23/2024 8:44:53 AM General Information Provided Yes Joseph [name] Final Disposition 11/23/2024 8:44:53 AM General Information Provided Yes Joseph [name], [name]

## 2024-11-27 ENCOUNTER — Other Ambulatory Visit

## 2024-12-17 ENCOUNTER — Other Ambulatory Visit

## 2024-12-17 ENCOUNTER — Ambulatory Visit: Payer: Self-pay | Admitting: Medical

## 2024-12-17 DIAGNOSIS — E875 Hyperkalemia: Secondary | ICD-10-CM

## 2024-12-17 LAB — COMPREHENSIVE METABOLIC PANEL WITH GFR
ALT: 14 U/L (ref 3–53)
AST: 18 U/L (ref 5–37)
Albumin: 4.3 g/dL (ref 3.5–5.2)
Alkaline Phosphatase: 64 U/L (ref 39–117)
BUN: 22 mg/dL (ref 6–23)
CO2: 25 meq/L (ref 19–32)
Calcium: 9.4 mg/dL (ref 8.4–10.5)
Chloride: 107 meq/L (ref 96–112)
Creatinine, Ser: 1 mg/dL (ref 0.40–1.50)
GFR: 85.01 mL/min
Glucose, Bld: 89 mg/dL (ref 70–99)
Potassium: 4.3 meq/L (ref 3.5–5.1)
Sodium: 140 meq/L (ref 135–145)
Total Bilirubin: 0.5 mg/dL (ref 0.2–1.2)
Total Protein: 6.7 g/dL (ref 6.0–8.3)

## 2024-12-17 NOTE — Addendum Note (Signed)
 Addended by: TRUDY CURVIN RAMAN on: 12/17/2024 09:34 AM   Modules accepted: Orders

## 2025-01-07 DIAGNOSIS — Z860100 Personal history of colon polyps, unspecified: Secondary | ICD-10-CM | POA: Insufficient documentation

## 2025-01-07 DIAGNOSIS — K625 Hemorrhage of anus and rectum: Secondary | ICD-10-CM | POA: Insufficient documentation

## 2025-01-07 DIAGNOSIS — R143 Flatulence: Secondary | ICD-10-CM | POA: Insufficient documentation

## 2025-01-07 DIAGNOSIS — N200 Calculus of kidney: Secondary | ICD-10-CM | POA: Insufficient documentation

## 2025-01-07 DIAGNOSIS — L290 Pruritus ani: Secondary | ICD-10-CM | POA: Insufficient documentation

## 2025-01-07 DIAGNOSIS — R1032 Left lower quadrant pain: Secondary | ICD-10-CM | POA: Insufficient documentation

## 2025-01-07 DIAGNOSIS — K219 Gastro-esophageal reflux disease without esophagitis: Secondary | ICD-10-CM | POA: Insufficient documentation

## 2025-01-07 DIAGNOSIS — R300 Dysuria: Secondary | ICD-10-CM | POA: Insufficient documentation

## 2025-01-14 ENCOUNTER — Ambulatory Visit

## 2025-01-14 VITALS — BP 140/56 | HR 80 | Ht 68.0 in | Wt 223.4 lb

## 2025-01-14 DIAGNOSIS — I10 Essential (primary) hypertension: Secondary | ICD-10-CM | POA: Diagnosis not present

## 2025-01-14 DIAGNOSIS — I38 Endocarditis, valve unspecified: Secondary | ICD-10-CM | POA: Insufficient documentation

## 2025-01-14 NOTE — Patient Instructions (Signed)
 Medication Instructions:  Your physician recommends that you continue on your current medications as directed. Please refer to the Current Medication list given to you today.  *If you need a refill on your cardiac medications before your next appointment, please call your pharmacy*  Lab Work: None If you have labs (blood work) drawn today and your tests are completely normal, you will receive your results only by: MyChart Message (if you have MyChart) OR A paper copy in the mail If you have any lab test that is abnormal or we need to change your treatment, we will call you to review the results.  Testing/Procedures: Your physician has requested that you have an echocardiogram. Echocardiography is a painless test that uses sound waves to create images of your heart. It provides your doctor with information about the size and shape of your heart and how well your heart's chambers and valves are working. This procedure takes approximately one hour. There are no restrictions for this procedure. Please do NOT wear cologne, perfume, aftershave, or lotions (deodorant is allowed). Please arrive 15 minutes prior to your appointment time.  Please note: We ask at that you not bring children with you during ultrasound (echo/ vascular) testing. Due to room size and safety concerns, children are not allowed in the ultrasound rooms during exams. Our front office staff cannot provide observation of children in our lobby area while testing is being conducted. An adult accompanying a patient to their appointment will only be allowed in the ultrasound room at the discretion of the ultrasound technician under special circumstances. We apologize for any inconvenience.   Follow-Up: At [facility], you and your health needs are our priority.  As part of our continuing mission to provide you with exceptional heart care, our providers are all part of one team.  This team includes your primary Cardiologist  (physician) and Advanced Practice Providers or APPs (Physician Assistants and Nurse Practitioners) who all work together to provide you with the care you need, when you need it.  Your next appointment:   2 month(s)  Provider:   Alean Kobus, MD    We recommend signing up for the patient portal called MyChart.  Sign up information is provided on this After Visit Summary.  MyChart is used to connect with patients for Virtual Visits (Telemedicine).  Patients are able to view lab/test results, encounter notes, upcoming appointments, etc.  Non-urgent messages can be sent to your provider as well.   To learn more about what you can do with MyChart, go to [email or web address].   Other Instructions None

## 2025-01-14 NOTE — Assessment & Plan Note (Signed)
 Well-controlled. Continue current medications losartan  50 mg once daily and metoprolol  XL 25 mg once daily.

## 2025-01-14 NOTE — Assessment & Plan Note (Signed)
 Prominent diastolic murmur. Likely related to aortic insufficiency. No prior echocardiogram to review.  Will obtain transthoracic echocardiogram. Will review results once available and further management based on the test results.  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

## 2025-01-14 NOTE — Progress Notes (Signed)
 "  Cardiology Consultation:    Date:  01/14/2025   ID:  Joseph [name], DOB 1971-05-13, MRN [id number]  PCP:  Dorina Loving, PA-C  Cardiologist:  Alean SAUNDERS Aaronjames Kelsay, MD   Referring MD: Saguier, Edward, PA-C   No chief complaint on file.    ASSESSMENT AND PLAN:   Mr Henrichs 54 year old pleasant male with history of gout, hypertension, obesity, diastolic heart murmur. Routinely donates blood every few months. Drinks alcohol occasionally.  Non-smoker. No prior history of CAD, CHF, MI, CVA.  Problem List Items Addressed This Visit       Cardiovascular and Mediastinum   Essential hypertension, benign   Well-controlled. Continue current medications losartan  50 mg once daily and metoprolol  XL 25 mg once daily.      Relevant Orders   EKG 12-Lead (Completed)   ECHOCARDIOGRAM COMPLETE     Other   Diastolic murmur - Primary   Prominent diastolic murmur. Likely related to aortic insufficiency. No prior echocardiogram to review.  Will obtain transthoracic echocardiogram. Will review results once available and further management based on the test results.  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      Return to clinic tentatively in 2 months.   History of Present Illness:    Joseph [name] is a 54 y.o. male who is being seen today for the evaluation of heart murmur at the request of Saguier, Loving, [place].   Very pleasant man here for the visit by himself.  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  No regular exercise.  Has history of gout, hypertension, obesity, heart murmur. Routinely donates blood every few months. Drinks alcohol occasionally.  Non-smoker. No prior history of CAD, CHF, MI, CVA.  Mentions good functional capacity at baseline. No cardiac symptoms such as chest pain, shortness of breath,  orthopnea or paroxysmal nocturnal dyspnea.  No palpitations, lightheadedness, dizziness or syncopal episodes.  No blood in urine or stools.  EKG in the clinic today shows sinus rhythm heart rate 80/min, PR interval 126 ms, QRS duration 80 ms, isolated PVC noted.  No ischemic changes.   blood work from 12/17/2024 notes normal complete metabolic panel with sodium 140, potassium 4.3 BUN 22, creatinine 1, eGFR 85. Normal transaminases and alkaline phosphatase. Lipid panel from 11/09/2024 total cholesterol 188, HDL 50, LDL 119 and triglycerides 94. CBC 11/09/2024 hemoglobin 11.7, hematocrit 36.8, platelets 271 and WBC 8.2. Shows mild anemia in the setting of blood donation done few days prior to that..  10-year ASCVD risk score 5.2%.  Past Medical History:  Diagnosis Date   Anxiety reaction 06/07/2018   Back pain 03/24/2017   Calcification of spleen 03/24/2017   Dysuria 01/07/2025   Educated about COVID-19 virus infection 04/23/2019   Flatulence, eructation and gas pain 01/07/2025   Gastroesophageal reflux disease 01/07/2025   Glaucoma 06/24/2021   BOTH EYES   Gout    NO RECENT FLARES AS OF 06-25-2021   History of colonic polyps 01/07/2025   History of degenerative disc disease 03/24/2017   History of kidney stones 03/24/2017   HTN (hypertension) 10/10/2017   Hyperlipidemia, mixed 03/24/2017   Injury of finger 10/22/2024   Kidney stone 04/14/2015   Lateral epicondylitis 04/15/2009   Qualifier: Diagnosis of   By: Sharlet MD, James         Left lower quadrant pain 01/07/2025   Lipoma  03/24/2017   Neck pain 04/23/2019   Nephrolithiasis 01/07/2025   Obesity 04/15/2009   Qualifier: Diagnosis of   By: Sharlet MD, James         Onychomycosis 10/10/2017   Pain in finger of left hand 10/22/2024   LRF     Preventative health care 10/10/2017   Pruritus ani 01/07/2025   Rectal bleeding 01/07/2025   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     Tinnitus 03/24/2017   Travel advice encounter 10/10/2017   Wears glasses 06/24/2021    Past Surgical History:  Procedure Laterality Date   APPENDECTOMY     AGE 57   CYSTOSCOPY WITH RETROGRADE PYELOGRAM, URETEROSCOPY AND STENT PLACEMENT Right 06/26/2021   Procedure: CYSTOSCOPY WITH RIGHT RETROGRADE / RIGHT URETERAL DILATION/ URETEROSCOPY HOLMIUM LASER AND STENT PLACEMENT;  Surgeon: Watt Rush, MD;  Location[facility];  Service: Urology;  Laterality: Right;    Current Medications: Active Medications[1]   Allergies:   Patient has no known allergies.   Social History   Socioeconomic History   Marital status: Divorced    Spouse name: Not on file   Number of children: Not on file   Years of education: Not on file   Highest education level: Associate degree: occupational, scientist, product/process development, or vocational program  Occupational History   Not on file  Tobacco Use   Smoking status: Never   Smokeless tobacco: Never  Vaping Use   Vaping status: Never Used  Substance and Sexual Activity   Alcohol use: Not Currently    Alcohol/week: 2.0 standard drinks of alcohol    Comment: OCC WINE OR BEER   Drug use: Never   Sexual activity: Not Currently    Birth control/protection: None  Other Topics Concern   Not on file  Social History Narrative   Works as optician, dispensing, no dietary restrictions.   Social Drivers of Health   Tobacco Use: Low Risk (01/14/2025)   Patient History    Smoking Tobacco Use: Never    Smokeless Tobacco Use: Never    Passive Exposure: Not on file  Financial Resource Strain: Low Risk (11/02/2024)   Overall Financial Resource Strain (CARDIA)    Difficulty of Paying Living Expenses: Not very hard  Food Insecurity: No Food Insecurity (11/02/2024)   Epic    Worried About Programme Researcher, Broadcasting/film/video in the Last Year: Never true    Ran Out of Food in the Last Year: Never true  Transportation Needs: No  Transportation Needs (11/02/2024)   Epic    Lack of Transportation (Medical): No    Lack of Transportation (Non-Medical): No  Physical Activity: Inactive (11/02/2024)   Exercise Vital Sign    Days of Exercise per Week: 0 days    Minutes of Exercise per Session: Not on file  Stress: Stress Concern Present (11/02/2024)   [employer] of Occupational Health - Occupational Stress Questionnaire    Feeling of Stress: To some extent  Social Connections: Socially Isolated (11/02/2024)   Social Connection and Isolation Panel    Frequency of Communication with Friends and Family: More than three times a week    Frequency of Social Gatherings with Friends and Family: Never    Attends Religious Services: Never    Database Administrator or Organizations: No    Attends Engineer, Structural: Not on file    Marital Status: Divorced  Depression (PHQ2-9): Low Risk (11/09/2024)   Depression (PHQ2-9)    PHQ-2 Score: 0  Alcohol Screen: Low Risk (11/02/2024)   Alcohol Screen    Last Alcohol Screening Score (AUDIT): 2  Housing: Low Risk (11/02/2024)   Epic    Unable to Pay for Housing in the Last Year: No    Number of Times Moved in the Last Year: 0    Homeless in the Last Year: No  Utilities: Not on file  Health Literacy: Not on file     Family History: The patient's family history includes Diabetes in his mother; Glaucoma in his father; Hypertension in his father. ROS:   Please see the history of present illness.    All 14 point review of systems negative except as described per history of present illness.  EKGs/Labs/Other Studies Reviewed:    The following studies were reviewed today:   EKG:  EKG Interpretation Date/Time:  Monday January 14 2025 09:30:11 EST Ventricular Rate:  80 PR Interval:  126 QRS Duration:  80 QT Interval:  352 QTC Calculation: 405 R Axis:   42  Text Interpretation: Sinus rhythm with occasional Premature ventricular complexes When compared with ECG of  26-Jun-2021 08:09, Premature ventricular complexes are now Present Confirmed by Liborio Hai reddy [phone]) on 01/14/2025 9:41:41 AM    Recent Labs: 11/09/2024: Hemoglobin 11.7; Platelets 271.0 12/17/2024: ALT 14; BUN 22; Creatinine, Ser 1.00; Potassium 4.3; Sodium 140  Recent Lipid Panel    Component Value Date/Time   CHOL 188 11/09/2024 0921   TRIG 94.0 11/09/2024 0921   HDL 50.30 11/09/2024 0921   CHOLHDL 4 11/09/2024 0921   VLDL 18.8 11/09/2024 0921   LDLCALC 119 (H) 11/09/2024 0921   LDLCALC 113 (H) 11/10/2020 1139    Physical Exam:    VS:  BP (!) 140/56   Pulse 80   Ht 5' 8 (1.727 m)   Wt 223 lb 6.4 oz (101.3 kg)   SpO2 97%   BMI 33.97 kg/m     Wt Readings from Last 3 Encounters:  01/14/25 223 lb 6.4 oz (101.3 kg)  11/09/24 217 lb 3.2 oz (98.5 kg)  10/28/23 220 lb (99.8 kg)     GENERAL:  Well nourished, well developed in no acute distress NECK: No JVD; No carotid bruits CARDIAC: RRR, S1 and S2 present, diastolic heart murmur 3/6 intensity best heard left parasternal region. CHEST:  Clear to auscultation without rales, wheezing or rhonchi  Extremities: No pitting pedal edema. Pulses bilaterally symmetric with radial 2+ and dorsalis pedis 2+ NEUROLOGIC:  Alert and oriented x 3  Medication Adjustments/Labs and Tests Ordered: Current medicines are reviewed at length with the patient today.  Concerns regarding medicines are outlined above.  Orders Placed This Encounter  Procedures   EKG 12-Lead   ECHOCARDIOGRAM COMPLETE   No orders of the defined types were placed in this encounter.   Signed, Shaniah Baltes reddy Lakeasha Petion, MD, MPH, [facility]. 01/14/2025 10:06 AM    [facility]    [1]  Current Meds  Medication Sig   allopurinol  (ZYLOPRIM ) 300 MG tablet TAKE ONE TABLET BY MOUTH ONE TIME DAILY   diclofenac  (VOLTAREN ) 75 MG EC tablet TAKE ONE TABLET BY MOUTH TWICE DAILY *used gout flare and take with food* (Patient taking differently: as  needed.)   latanoprost (XALATAN) 0.005 % ophthalmic solution 1 drop at bedtime.   losartan  (COZAAR ) 50 MG tablet TAKE ONE TABLET BY MOUTH ONE TIME DAILY   metoprolol  succinate (TOPROL -XL) 25 MG  24 hr tablet TAKE ONE TABLET BY MOUTH ONCE A DAY   VITAMIN D PO Take by mouth. 2000 UNITS VITAMIN D QOD   "

## 2025-01-27 ENCOUNTER — Other Ambulatory Visit: Payer: Self-pay | Admitting: Medical

## 2025-01-28 NOTE — Telephone Encounter (Signed)
 Follow up in June. But also do need to verify has patient followed up with urologist as I recommended in November on lab review. Has he seen urologist in the past. His psa has been in 4 range in the past and close to 4 in November so want to know following up with urologist.

## 2025-02-15 ENCOUNTER — Ambulatory Visit (HOSPITAL_COMMUNITY)

## 2025-03-19 ENCOUNTER — Ambulatory Visit
